# Patient Record
Sex: Male | Born: 1995 | Race: Black or African American | Hispanic: No | Marital: Single | Smoking: Never smoker
Health system: Southern US, Community
[De-identification: ages and names within clinical notes are randomized; demographics above are authoritative.]

## PROBLEM LIST (undated history)

## (undated) ENCOUNTER — Emergency Department (HOSPITAL_COMMUNITY): Admission: EM | Source: Home / Self Care

## (undated) DIAGNOSIS — D649 Anemia, unspecified: Secondary | ICD-10-CM

## (undated) DIAGNOSIS — D550 Anemia due to glucose-6-phosphate dehydrogenase [G6PD] deficiency: Secondary | ICD-10-CM

## (undated) DIAGNOSIS — D573 Sickle-cell trait: Secondary | ICD-10-CM

---
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
## 2013-11-28 ENCOUNTER — Encounter (HOSPITAL_COMMUNITY): Payer: Self-pay | Admitting: Emergency Medicine

## 2013-11-28 DIAGNOSIS — R1010 Upper abdominal pain, unspecified: Secondary | ICD-10-CM | POA: Diagnosis present

## 2013-11-28 DIAGNOSIS — R1013 Epigastric pain: Secondary | ICD-10-CM | POA: Diagnosis not present

## 2013-11-28 LAB — CBC WITH DIFFERENTIAL/PLATELET
BASOS ABS: 0 10*3/uL (ref 0.0–0.1)
BASOS PCT: 0 % (ref 0–1)
EOS ABS: 0.1 10*3/uL (ref 0.0–0.7)
Eosinophils Relative: 2 % (ref 0–5)
HCT: 41.8 % (ref 39.0–52.0)
Hemoglobin: 14.7 g/dL (ref 13.0–17.0)
Lymphocytes Relative: 48 % — ABNORMAL HIGH (ref 12–46)
Lymphs Abs: 2.2 10*3/uL (ref 0.7–4.0)
MCH: 30.6 pg (ref 26.0–34.0)
MCHC: 35.2 g/dL (ref 30.0–36.0)
MCV: 87.1 fL (ref 78.0–100.0)
MONO ABS: 0.4 10*3/uL (ref 0.1–1.0)
MONOS PCT: 9 % (ref 3–12)
NEUTROS ABS: 1.9 10*3/uL (ref 1.7–7.7)
Neutrophils Relative %: 41 % — ABNORMAL LOW (ref 43–77)
PLATELETS: 254 10*3/uL (ref 150–400)
RBC: 4.8 MIL/uL (ref 4.22–5.81)
RDW: 11.2 % — ABNORMAL LOW (ref 11.5–15.5)
WBC: 4.6 10*3/uL (ref 4.0–10.5)

## 2013-11-28 LAB — COMPREHENSIVE METABOLIC PANEL
ALBUMIN: 4.4 g/dL (ref 3.5–5.2)
ALT: 11 U/L (ref 0–53)
ANION GAP: 11 (ref 5–15)
AST: 21 U/L (ref 0–37)
Alkaline Phosphatase: 86 U/L (ref 39–117)
BILIRUBIN TOTAL: 0.7 mg/dL (ref 0.3–1.2)
BUN: 9 mg/dL (ref 6–23)
CALCIUM: 9.6 mg/dL (ref 8.4–10.5)
CHLORIDE: 100 meq/L (ref 96–112)
CO2: 27 meq/L (ref 19–32)
Creatinine, Ser: 0.98 mg/dL (ref 0.50–1.35)
GFR calc non Af Amer: >90 mL/min (ref >90)
GLUCOSE: 63 mg/dL — AB (ref 70–99)
Potassium: 3.8 mEq/L (ref 3.7–5.3)
SODIUM: 138 meq/L (ref 137–147)
TOTAL PROTEIN: 8.4 g/dL — AB (ref 6.0–8.3)

## 2013-11-28 LAB — LIPASE, BLOOD: Lipase: 28 U/L (ref 11–59)

## 2013-11-28 NOTE — ED Notes (Signed)
Pt. reports mid abdominal pain with emesis( x1) onset this evening , denies diarrhea/ no fever or chills.

## 2013-11-29 ENCOUNTER — Emergency Department (HOSPITAL_COMMUNITY)
Admission: EM | Admit: 2013-11-29 | Discharge: 2013-11-29 | Disposition: A | Discharge status: Home / Self Care | Payer: Medicaid Other | Attending: Emergency Medicine | Admitting: Emergency Medicine

## 2013-11-29 DIAGNOSIS — R1013 Epigastric pain: Secondary | ICD-10-CM

## 2013-11-29 LAB — URINALYSIS, ROUTINE W REFLEX MICROSCOPIC
BILIRUBIN URINE: NEGATIVE
GLUCOSE, UA: NEGATIVE mg/dL
Hgb urine dipstick: NEGATIVE
Ketones, ur: NEGATIVE mg/dL
LEUKOCYTES UA: NEGATIVE
Nitrite: NEGATIVE
Protein, ur: NEGATIVE mg/dL
SPECIFIC GRAVITY, URINE: 1.019 (ref 1.005–1.030)
UROBILINOGEN UA: 2 mg/dL — AB (ref 0.0–1.0)
pH: 6.5 (ref 5.0–8.0)

## 2013-11-29 MED ORDER — GI COCKTAIL ~~LOC~~
30.0000 mL | Freq: Once | ORAL | Status: AC
  Administered 2013-11-29: 30 mL via ORAL
  Filled 2013-11-29: qty 30

## 2013-11-29 MED ORDER — ONDANSETRON 8 MG PO TBDP: 8.0000 mg | ORAL_TABLET | Freq: Three times a day (TID) | ORAL | Status: DC | PRN

## 2013-11-29 MED ORDER — FAMOTIDINE 20 MG PO TABS: 20.0000 mg | ORAL_TABLET | Freq: Two times a day (BID) | ORAL | Status: DC

## 2013-11-29 MED ORDER — FAMOTIDINE 20 MG PO TABS
20.0000 mg | ORAL_TABLET | Freq: Once | ORAL | Status: AC
  Administered 2013-11-29: 20 mg via ORAL
  Filled 2013-11-29: qty 1

## 2013-11-29 NOTE — ED Provider Notes (Signed)
CSN: [id number]     Arrival date & time 11/28/13  1929 History   First MD Initiated Contact with Patient 11/29/13 0120     Chief Complaint  Patient presents with  . Abdominal Pain     (Consider location/radiation/quality/duration/timing/severity/associated sxs/prior Treatment) HPI 18 year old male presents to emergency room from home with complaint of upper abdominal pain and one episode of vomiting.  Patient reports eating a fried seafood restaurant Saturday.  He reports not being hungry on Sunday.  Today he had [employer] for dinner.  Soon after eating he had dull pain in his upper abdomen followed by one episode of vomiting of clear emesis.  He denies any further emesis no longer nauseated.  He reports a dull aching pain.  No fevers or chills no back pain no prior history of same.  No one else is sick. History reviewed. No pertinent past medical history. History reviewed. No pertinent past surgical history. No family history on file. History  Substance Use Topics  . Smoking status: Never Smoker   . Smokeless tobacco: Not on file  . Alcohol Use: No    Review of Systems   See History of Present Illness; otherwise all other systems are reviewed and negative  Allergies  Aspirin and Sulfa antibiotics  Home Medications   Prior to Admission medications   Not on File   BP 126/76  Pulse 50  Temp(Src) 98.2 F (36.8 C) (Oral)  Resp 20  Wt 187 lb 9 oz (85.078 kg)  SpO2 100% Physical Exam  Nursing note and vitals reviewed. Constitutional: He is oriented to person, place, and time. He appears well-developed and well-nourished.  HENT:  Head: Normocephalic and atraumatic.  Nose: Nose normal.  Mouth/Throat: Oropharynx is clear and moist.  Eyes: Conjunctivae and EOM are normal. Pupils are equal, round, and reactive to light.  Neck: Normal range of motion. Neck supple. No JVD present. No tracheal deviation present. No thyromegaly present.  Cardiovascular: Normal rate, regular  rhythm, normal heart sounds and intact distal pulses.  Exam reveals no gallop and no friction rub.   No murmur heard. Pulmonary/Chest: Effort normal and breath sounds normal. No stridor. No respiratory distress. He has no wheezes. He has no rales. He exhibits no tenderness.  Abdominal: Soft. Bowel sounds are normal. He exhibits no distension and no mass. There is no tenderness. There is no rebound and no guarding.  Musculoskeletal: Normal range of motion. He exhibits no edema and no tenderness.  Lymphadenopathy:    He has no cervical adenopathy.  Neurological: He is alert and oriented to person, place, and time. He displays normal reflexes. He exhibits normal muscle tone. Coordination normal.  Skin: Skin is warm and dry. No rash noted. No erythema. No pallor.  Psychiatric: He has a normal mood and affect. His behavior is normal. Judgment and thought content normal.    ED Course  Procedures (including critical care time) Labs Review Labs Reviewed  CBC WITH DIFFERENTIAL - Abnormal; Notable for the following:    RDW 11.2 (*)    Neutrophils Relative % 41 (*)    Lymphocytes Relative 48 (*)    All other components within normal limits  COMPREHENSIVE METABOLIC PANEL - Abnormal; Notable for the following:    Glucose, Bld 63 (*)    Total Protein 8.4 (*)    All other components within normal limits  URINALYSIS, ROUTINE W REFLEX MICROSCOPIC - Abnormal; Notable for the following:    Urobilinogen, UA 2.0 (*)    All other  components within normal limits  LIPASE, BLOOD    Imaging Review No results found.   EKG Interpretation None      MDM   Final diagnoses:  Epigastric pain    18 year old male with epigastric pain after eating [employer], after going a day and a half without food.  Patient is well appearing, labs are unremarkable.  He has no abdominal pain on exam.  Suspect gastritis.  We'll have him followup with his primary care doctor as needed.  Will treat with GI cocktail and Pepcid  here.  We'll have him take Pepcid for the next few days to help with any residual symptoms.    Olivia Mackielga M Lot Medford, MD 11/29/13 [phone]

## 2013-11-29 NOTE — Discharge Instructions (Signed)
Stick to a bland diet for the next few days.  Your workup today has not shown a specific cause for your symptoms.  It is thought that you have some stomach irritation.  Take medications as prescribed.  Return emergency room for worsening condition or new concerning symptoms.   Abdominal Pain Many things can cause belly (abdominal) pain. Most times, the belly pain is not dangerous. Many cases of belly pain can be watched and treated at home. HOME CARE   Do not take medicines that help you go poop (laxatives) unless told to by your doctor.  Only take medicine as told by your doctor.  Eat or drink as told by your doctor. Your doctor will tell you if you should be on a special diet. GET HELP IF:  You do not know what is causing your belly pain.  You have belly pain while you are sick to your stomach (nauseous) or have runny poop (diarrhea).  You have pain while you pee or poop.  Your belly pain wakes you up at night.  You have belly pain that gets worse or better when you eat.  You have belly pain that gets worse when you eat fatty foods.  You have a fever. GET HELP RIGHT AWAY IF:   The pain does not go away within 2 hours.  You keep throwing up (vomiting).  The pain changes and is only in the right or left part of the belly.  You have bloody or tarry looking poop. MAKE SURE YOU:   Understand these instructions.  Will watch your condition.  Will get help right away if you are not doing well or get worse. Document Released: 07/23/2007 Document Revised: 02/08/2013 Document Reviewed: 10/13/2012 [facility] Patient Information 2015 [employer], [place]. This information is not intended to replace advice given to you by your health care provider. Make sure you discuss any questions you have with your health care provider.

## 2014-05-17 ENCOUNTER — Encounter (HOSPITAL_COMMUNITY): Payer: Self-pay | Admitting: *Deleted

## 2014-05-17 ENCOUNTER — Emergency Department (INDEPENDENT_AMBULATORY_CARE_PROVIDER_SITE_OTHER)
Admission: EM | Admit: 2014-05-17 | Discharge: 2014-05-17 | Disposition: A | Discharge status: Home / Self Care | Payer: Medicaid Other | Source: Home / Self Care | Attending: Family Medicine | Admitting: Family Medicine

## 2014-05-17 DIAGNOSIS — J302 Other seasonal allergic rhinitis: Secondary | ICD-10-CM | POA: Diagnosis not present

## 2014-05-17 MED ORDER — IPRATROPIUM BROMIDE 0.06 % NA SOLN: 2.0000 | Freq: Four times a day (QID) | NASAL | Status: DC

## 2014-05-17 MED ORDER — METHYLPREDNISOLONE ACETATE 80 MG/ML IJ SUSP
80.0000 mg | Freq: Once | INTRAMUSCULAR | Status: AC
  Administered 2014-05-17: 80 mg via INTRAMUSCULAR

## 2014-05-17 MED ORDER — METHYLPREDNISOLONE ACETATE 80 MG/ML IJ SUSP
INTRAMUSCULAR | Status: AC
  Filled 2014-05-17: qty 1

## 2014-05-17 NOTE — ED Provider Notes (Signed)
CSN: [id number]     Arrival date & time 05/17/14  1922 History   First MD Initiated Contact with Patient 05/17/14 2017     Chief Complaint  Patient presents with  . Headache   (Consider location/radiation/quality/duration/timing/severity/associated sxs/prior Treatment) Patient is a 19 y.o. male presenting with headaches. The history is provided by the patient.  Headache Pain location:  L parietal Quality:  Dull Radiates to:  Does not radiate Progression:  Unchanged Chronicity:  New Similar to prior headaches: no   Context: not activity, not exposure to bright light, not caffeine, not defecating and not stress   Associated symptoms: congestion   Associated symptoms: no blurred vision, no dizziness, no fever, no loss of balance, no neck pain, no neck stiffness, no numbness and no photophobia     History reviewed. No pertinent past medical history. Past Surgical History  Procedure Laterality Date  . Wisdom tooth extraction  2012   History reviewed. No pertinent family history. History  Substance Use Topics  . Smoking status: Never Smoker   . Smokeless tobacco: Not on file  . Alcohol Use: Yes     Comment: occasional    Review of Systems  Constitutional: Negative.  Negative for fever.  HENT: Positive for congestion.   Eyes: Negative for blurred vision and photophobia.  Musculoskeletal: Negative for neck pain and neck stiffness.  Neurological: Positive for headaches. Negative for dizziness, numbness and loss of balance.    Allergies  Aspirin; Penicillins; and Sulfa antibiotics  Home Medications   Prior to Admission medications   Medication Sig Start Date End Date Taking? Authorizing Provider  ibuprofen (ADVIL,MOTRIN) 200 MG tablet Take 400 mg by mouth every 6 (six) hours as needed.   Yes Historical Provider, MD  famotidine (PEPCID) 20 MG tablet Take 1 tablet (20 mg total) by mouth 2 (two) times daily. 11/29/13 12/04/13  Marisa Severinlga Otter, MD  ipratropium (ATROVENT) 0.06 % nasal  spray Place 2 sprays into both nostrils 4 (four) times daily. 05/17/14   Linna HoffJames D Rei Contee, MD  ondansetron (ZOFRAN ODT) 8 MG disintegrating tablet Take 1 tablet (8 mg total) by mouth every 8 (eight) hours as needed for nausea or vomiting. 11/29/13   Marisa Severinlga Otter, MD   BP 137/74 mmHg  Pulse 53  Temp(Src) 98.5 F (36.9 C) (Oral)  Resp 16  SpO2 100% Physical Exam  Constitutional: He is oriented to person, place, and time. He appears well-developed and well-nourished.  Eyes: Conjunctivae and EOM are normal. Pupils are equal, round, and reactive to light.  Neck: Normal range of motion. Neck supple.  Musculoskeletal: Normal range of motion.  Lymphadenopathy:    He has no cervical adenopathy.  Neurological: He is alert and oriented to person, place, and time. He has normal reflexes. He displays normal reflexes. No cranial nerve deficit. Coordination normal.  Nursing note and vitals reviewed.   ED Course  Procedures (including critical care time) Labs Review Labs Reviewed - No data to display  Imaging Review No results found.   MDM   1. Seasonal allergic rhinitis        Linna HoffJames D Germany Chelf, MD 05/17/14 2056

## 2014-05-17 NOTE — ED Notes (Signed)
C/o headache onset yesterday @ 1130.  He tried Ibuprofen 400 mg. yesterday and 400 mg. @ 1330 today.  Pain on L parietal area.  No N or V.  No photophobia.  When he rubs his L eye it causes the pain in L parietal area.

## 2015-02-15 ENCOUNTER — Encounter (HOSPITAL_COMMUNITY): Payer: Self-pay | Admitting: Cardiology

## 2015-02-15 ENCOUNTER — Emergency Department (HOSPITAL_COMMUNITY)
Admission: EM | Admit: 2015-02-15 | Discharge: 2015-02-15 | Disposition: A | Discharge status: Home / Self Care | Payer: Medicaid Other | Attending: Emergency Medicine | Admitting: Emergency Medicine

## 2015-02-15 DIAGNOSIS — Z880 Allergy status to penicillin: Secondary | ICD-10-CM | POA: Insufficient documentation

## 2015-02-15 DIAGNOSIS — M25552 Pain in left hip: Secondary | ICD-10-CM | POA: Insufficient documentation

## 2015-02-15 DIAGNOSIS — M25551 Pain in right hip: Secondary | ICD-10-CM | POA: Insufficient documentation

## 2015-02-15 DIAGNOSIS — Z79899 Other long term (current) drug therapy: Secondary | ICD-10-CM | POA: Insufficient documentation

## 2015-02-15 DIAGNOSIS — M7918 Myalgia, other site: Secondary | ICD-10-CM

## 2015-02-15 MED ORDER — NAPROXEN 500 MG PO TABS: 500.0000 mg | ORAL_TABLET | Freq: Two times a day (BID) | ORAL | Status: DC

## 2015-02-15 NOTE — ED Notes (Signed)
Pt reports bilateral hip pain that started about a week ago. Denies any injury or other symptoms.

## 2015-02-15 NOTE — ED Provider Notes (Signed)
CSN: [id number]     Arrival date & time 02/15/15  1657 History  By signing my name below, I, Justin Sheppard, attest that this documentation has been prepared under the direction and in the presence of General MillsBenjamin Keante Urizar, PA-C. Electronically Signed: Angelene GiovanniEmmanuella Sheppard, ED Scribe. 02/15/2015. 7:08 PM.     Chief Complaint  Patient presents with  . Hip Pain   The history is provided by the patient. No language interpreter was used.   HPI Comments: Justin Sheppard is a 19 y.o. male who presents to the Emergency Department complaining of bilateral side hip pain onset one week ago. Unable to characterize pain. He states that the pain is worse when he initially tries to move but once he gets going, he feels much better. He explains that the pain is better with warmth and worse with the cold. He reports that he sits a lot in the office at work. He states that he is here because he has a family hx of gout and arthritis. No alleviating factors noted. He denies any fever, SOB, rash, n/v, urinary symptoms, back pain, numbness or weakness, or bowel/bladder incontinence.     History reviewed. No pertinent past medical history. Past Surgical History  Procedure Laterality Date  . Wisdom tooth extraction  2012   History reviewed. No pertinent family history. Social History  Substance Use Topics  . Smoking status: Never Smoker   . Smokeless tobacco: None  . Alcohol Use: Yes     Comment: occasional    Review of Systems  Constitutional: Negative for fever.  Respiratory: Negative for shortness of breath.   Gastrointestinal: Negative for nausea and vomiting.  Genitourinary: Negative for dysuria, frequency and hematuria.  Musculoskeletal: Positive for arthralgias (bilateral hip pain). Negative for back pain.  Skin: Negative for rash.  All other systems reviewed and are negative.     Allergies  Aspirin; Penicillins; and Sulfa antibiotics  Home Medications   Prior to Admission medications    Medication Sig Start Date End Date Taking? Authorizing Provider  famotidine (PEPCID) 20 MG tablet Take 1 tablet (20 mg total) by mouth 2 (two) times daily. 11/29/13 12/04/13  Marisa Severinlga Otter, MD  ibuprofen (ADVIL,MOTRIN) 200 MG tablet Take 400 mg by mouth every 6 (six) hours as needed.    Historical Provider, MD  ipratropium (ATROVENT) 0.06 % nasal spray Place 2 sprays into both nostrils 4 (four) times daily. 05/17/14   Linna HoffJames D Kindl, MD  naproxen (NAPROSYN) 500 MG tablet Take 1 tablet (500 mg total) by mouth 2 (two) times daily. 02/15/15   Joycie PeekBenjamin Gifford Ballon, PA-C  ondansetron (ZOFRAN ODT) 8 MG disintegrating tablet Take 1 tablet (8 mg total) by mouth every 8 (eight) hours as needed for nausea or vomiting. 11/29/13   Marisa Severinlga Otter, MD   BP 134/59 mmHg  Pulse 56  Temp(Src) 98 F (36.7 C) (Oral)  Resp 16  SpO2 100% Physical Exam  Constitutional: He is oriented to person, place, and time. He appears well-developed and well-nourished. No distress.  HENT:  Head: Normocephalic and atraumatic.  Eyes: Conjunctivae and EOM are normal.  Neck: Neck supple. No tracheal deviation present.  Cardiovascular: Normal rate, regular rhythm and normal heart sounds.   Pulmonary/Chest: Effort normal. No respiratory distress.  Abdominal: Soft. There is no tenderness.  Musculoskeletal: Normal range of motion.  Discomfort exacerbated with standing up, adduction of bilateral hips. No redness, swelling or erythema Full ROM of BLE as well as lumbar spine   Neurological: He is alert and oriented to  person, place, and time.  Gait is baseline without ataxia. Motor strength and sensation are baseline.  Skin: Skin is warm and dry.  Psychiatric: He has a normal mood and affect. His behavior is normal.  Nursing note and vitals reviewed.   ED Course  Procedures (including critical care time) DIAGNOSTIC STUDIES: Oxygen Saturation is 100% on RA, normal by my interpretation.    COORDINATION OF CARE: 6:40 PM- Pt advised of  plan for treatment and pt agrees. Recommended to take hot showers and use heating pads. Explained that symptoms are not consistent with gout or arthritis.   Meds given in ED:  Medications - No data to display  New Prescriptions   NAPROXEN (NAPROSYN) 500 MG TABLET    Take 1 tablet (500 mg total) by mouth 2 (two) times daily.   Filed Vitals:   02/15/15 1811 02/15/15 1913  BP: 129/80 134/59  Pulse: 57 56  Temp: 98 F (36.7 C)   TempSrc: Oral   Resp: 18 16  SpO2: 100% 100%    MDM  Justin Sheppard is a 19 y.o. male who presents for evaluation of bilateral hip pain. Patient has nonfocal neuro exam. Discomfort most consistent with musculoskeletal pain. We'll try trial of NSAIDs and stretching exercises. Discussed follow-up with PCP for further evaluation and management of symptoms. Administration agrees with this plan and is amenable to discharge. No evidence of other acute or emergent pathology. The patient appears reasonably screened and/or stabilized for discharge and I doubt any other medical condition or other [facility] requiring further screening, evaluation, or treatment in the ED at this time prior to discharge.   Final diagnoses:  Musculoskeletal pain    I personally performed the services described in this documentation, which was scribed in my presence. The recorded information has been reviewed and is accurate.   Joycie Peek, PA-C 02/15/15 1919  Margarita Grizzle, MD 02/17/15 [phone]

## 2015-02-15 NOTE — Discharge Instructions (Signed)
There is not appear to be an emergent cause for your symptoms at this time. Your discomfort is likely due to musculoskeletal pain. Take your medications as prescribed. He may try stretching and other symptoms management as we discussed. Return to ED for any new or worsening symptoms.  Musculoskeletal Pain Musculoskeletal pain is muscle and boney aches and pains. These pains can occur in any part of the body. Your caregiver may treat you without knowing the cause of the pain. They may treat you if blood or urine tests, X-rays, and other tests were normal.  CAUSES There is often not a definite cause or reason for these pains. These pains may be caused by a type of germ (virus). The discomfort may also come from overuse. Overuse includes working out too hard when your body is not fit. Boney aches also come from weather changes. Bone is sensitive to atmospheric pressure changes. HOME CARE INSTRUCTIONS   Ask when your test results will be ready. Make sure you get your test results.  Only take over-the-counter or prescription medicines for pain, discomfort, or fever as directed by your caregiver. If you were given medications for your condition, do not drive, operate machinery or power tools, or sign legal documents for 24 hours. Do not drink alcohol. Do not take sleeping pills or other medications that may interfere with treatment.  Continue all activities unless the activities cause more pain. When the pain lessens, slowly resume normal activities. Gradually increase the intensity and duration of the activities or exercise.  During periods of severe pain, bed rest may be helpful. Lay or sit in any position that is comfortable.  Putting ice on the injured area.  Put ice in a bag.  Place a towel between your skin and the bag.  Leave the ice on for 15 to 20 minutes, 3 to 4 times a day.  Follow up with your caregiver for continued problems and no reason can be found for the pain. If the pain becomes  worse or does not go away, it may be necessary to repeat tests or do additional testing. Your caregiver may need to look further for a possible cause. SEEK IMMEDIATE MEDICAL CARE IF:  You have pain that is getting worse and is not relieved by medications.  You develop chest pain that is associated with shortness or breath, sweating, feeling sick to your stomach (nauseous), or throw up (vomit).  Your pain becomes localized to the abdomen.  You develop any new symptoms that seem different or that concern you. MAKE SURE YOU:   Understand these instructions.  Will watch your condition.  Will get help right away if you are not doing well or get worse.   This information is not intended to replace advice given to you by your health care provider. Make sure you discuss any questions you have with your health care provider.   Document Released: 02/03/2005 Document Revised: 04/28/2011 Document Reviewed: 10/08/2012 Elsevier Interactive Patient Education [employer]2016 [employer]

## 2015-02-15 NOTE — ED Notes (Signed)
Pt ambulatory to room with steady gait, NAD 

## 2015-04-16 ENCOUNTER — Encounter (HOSPITAL_COMMUNITY): Payer: Self-pay | Admitting: *Deleted

## 2015-04-16 DIAGNOSIS — Z791 Long term (current) use of non-steroidal anti-inflammatories (NSAID): Secondary | ICD-10-CM | POA: Insufficient documentation

## 2015-04-16 DIAGNOSIS — Z79899 Other long term (current) drug therapy: Secondary | ICD-10-CM | POA: Insufficient documentation

## 2015-04-16 DIAGNOSIS — R112 Nausea with vomiting, unspecified: Secondary | ICD-10-CM | POA: Insufficient documentation

## 2015-04-16 DIAGNOSIS — Z880 Allergy status to penicillin: Secondary | ICD-10-CM | POA: Insufficient documentation

## 2015-04-16 NOTE — ED Notes (Signed)
Pt in c/o vomiting x1 and headache that started this evening, no distress noted

## 2015-04-17 ENCOUNTER — Emergency Department (HOSPITAL_COMMUNITY)
Admission: EM | Admit: 2015-04-17 | Discharge: 2015-04-17 | Disposition: A | Discharge status: Home / Self Care | Payer: Medicaid Other | Attending: Emergency Medicine | Admitting: Emergency Medicine

## 2015-04-17 DIAGNOSIS — R112 Nausea with vomiting, unspecified: Secondary | ICD-10-CM

## 2015-04-17 MED ORDER — ONDANSETRON 4 MG PO TBDP
4.0000 mg | ORAL_TABLET | Freq: Once | ORAL | Status: AC
  Administered 2015-04-17: 4 mg via ORAL

## 2015-04-17 MED ORDER — ONDANSETRON 4 MG PO TBDP: 4.0000 mg | ORAL_TABLET | Freq: Three times a day (TID) | ORAL | Status: DC | PRN

## 2015-04-17 NOTE — ED Notes (Signed)
Pt provided PO challenge

## 2015-04-17 NOTE — Discharge Instructions (Signed)

## 2015-04-17 NOTE — ED Provider Notes (Signed)
CSN: [id number]     Arrival date & time 04/16/15  2240 History   By signing my name below, I, Justin Sheppard, attest that this documentation has been prepared under the direction and in the presence of Shon Baton, MD.  Electronically Signed: Arlan Sheppard, ED Scribe. 04/17/2015. 2:49 AM.   Chief Complaint  Patient presents with  . Emesis   The history is provided by the patient. No language interpreter was used.    HPI Comments: Justin Sheppard is a 20 y.o. male without any pertinent past medical history who presents to the Emergency Department complaining of sudden onset, resolving vomiting with associated nausea onset 2-3 hours prior to arrival. Pt reports 2 episodes of vomiting while at a game earlier this evening and after eating some pizza. No OTC medications or home remedies attempted prior to arrival. No recent fever, chills, diarrhea, chest pain, shortness of breath, or abdominal pain. No alcohol consumption or illicit drug use.  PCP: No PCP Per Patient    History reviewed. No pertinent past medical history. Past Surgical History  Procedure Laterality Date  . Wisdom tooth extraction  2012   History reviewed. No pertinent family history. Social History  Substance Use Topics  . Smoking status: Never Smoker   . Smokeless tobacco: None  . Alcohol Use: Yes     Comment: occasional    Review of Systems  Constitutional: Negative for fever and chills.  Respiratory: Negative for cough and shortness of breath.   Cardiovascular: Negative for chest pain.  Gastrointestinal: Positive for nausea and vomiting. Negative for abdominal pain and diarrhea.  Genitourinary: Negative for dysuria.  Musculoskeletal: Negative for back pain.  Neurological: Negative for headaches.  Psychiatric/Behavioral: Negative for confusion.  All other systems reviewed and are negative.     Allergies  Aspirin; Penicillins; and Sulfa antibiotics  Home Medications   Prior to Admission medications    Medication Sig Start Date End Date Taking? Authorizing Provider  famotidine (PEPCID) 20 MG tablet Take 1 tablet (20 mg total) by mouth 2 (two) times daily. 11/29/13 12/04/13  Marisa Severin, MD  ibuprofen (ADVIL,MOTRIN) 200 MG tablet Take 400 mg by mouth every 6 (six) hours as needed.    Historical Provider, MD  ipratropium (ATROVENT) 0.06 % nasal spray Place 2 sprays into both nostrils 4 (four) times daily. 05/17/14   Linna Hoff, MD  naproxen (NAPROSYN) 500 MG tablet Take 1 tablet (500 mg total) by mouth 2 (two) times daily. 02/15/15   Joycie Peek, PA-C  ondansetron (ZOFRAN-ODT) 4 MG disintegrating tablet Take 1 tablet (4 mg total) by mouth every 8 (eight) hours as needed for nausea or vomiting. 04/17/15   Shon Baton, MD   Triage Vitals: BP 131/67 mmHg  Pulse 40  Temp(Src) 97.7 F (36.5 C) (Oral)  Resp 16  Ht  (1.854 m)  Wt 185 lb (83.915 kg)  BMI 24.41 kg/m2  SpO2 100%   Physical Exam  Constitutional: He is oriented to person, place, and time. He appears well-developed and well-nourished. No distress.  HENT:  Head: Normocephalic and atraumatic.  Cardiovascular: Normal rate, regular rhythm and normal heart sounds.   No murmur heard. Pulmonary/Chest: Effort normal and breath sounds normal. No respiratory distress. He has no wheezes.  Abdominal: Soft. Bowel sounds are normal. There is no tenderness. There is no rebound and no guarding.  Neurological: He is alert and oriented to person, place, and time.  Skin: Skin is warm and dry.  Psychiatric: He has a  normal mood and affect.  Nursing note and vitals reviewed.   ED Course  Procedures (including critical care time)  DIAGNOSTIC STUDIES: Oxygen Saturation is 100% on RA, Normal by my interpretation.    COORDINATION OF CARE: 2:40 AM-Discussed treatment plan with pt at bedside and pt agreed to plan.     Labs Review Labs Reviewed - No data to display  Imaging Review No results found. I have personally reviewed  and evaluated these images and lab results as part of my medical decision-making.   EKG Interpretation None      MDM   Final diagnoses:  Non-intractable vomiting with nausea, vomiting of unspecified type    Patient presents with isolated vomiting after eating pizza prior to arrival. He states that he feels much better. No associated abdominal pain. Patient may have experienced gastritis versus mild food poisoning. He was given Zofran and able to tolerate fluids. Physical exam is benign. No lab work indicated at this time.   After history, exam, and medical workup I feel the patient has been appropriately medically screened and is safe for discharge home. Pertinent diagnoses were discussed with the patient. Patient was given return precautions.  I personally performed the services described in this documentation, which was scribed in my presence. The recorded information has been reviewed and is accurate.   Shon Baton, MD 04/17/15 [phone]

## 2015-04-17 NOTE — ED Notes (Signed)
Pt passed PO challenge.

## 2015-10-18 ENCOUNTER — Emergency Department (HOSPITAL_COMMUNITY)
Admission: EM | Admit: 2015-10-18 | Discharge: 2015-10-19 | Disposition: A | Discharge status: Home / Self Care | Payer: Medicaid Other | Attending: Emergency Medicine | Admitting: Emergency Medicine

## 2015-10-18 ENCOUNTER — Encounter (HOSPITAL_COMMUNITY): Payer: Self-pay | Admitting: Emergency Medicine

## 2015-10-18 DIAGNOSIS — Z79899 Other long term (current) drug therapy: Secondary | ICD-10-CM | POA: Insufficient documentation

## 2015-10-18 DIAGNOSIS — J029 Acute pharyngitis, unspecified: Secondary | ICD-10-CM | POA: Insufficient documentation

## 2015-10-18 DIAGNOSIS — R6889 Other general symptoms and signs: Secondary | ICD-10-CM

## 2015-10-18 DIAGNOSIS — J069 Acute upper respiratory infection, unspecified: Secondary | ICD-10-CM

## 2015-10-18 LAB — COMPREHENSIVE METABOLIC PANEL
ALBUMIN: 3.9 g/dL (ref 3.5–5.0)
ALT: 19 U/L (ref 17–63)
AST: 28 U/L (ref 15–41)
Alkaline Phosphatase: 71 U/L (ref 38–126)
Anion gap: 6 (ref 5–15)
BUN: 10 mg/dL (ref 6–20)
CHLORIDE: 105 mmol/L (ref 101–111)
CO2: 26 mmol/L (ref 22–32)
Calcium: 8.9 mg/dL (ref 8.9–10.3)
Creatinine, Ser: 1.05 mg/dL (ref 0.61–1.24)
GFR calc Af Amer: >60 mL/min (ref >60)
GFR calc non Af Amer: >60 mL/min (ref >60)
GLUCOSE: 100 mg/dL — AB (ref 65–99)
POTASSIUM: 3.7 mmol/L (ref 3.5–5.1)
Sodium: 137 mmol/L (ref 135–145)
Total Bilirubin: 0.7 mg/dL (ref 0.3–1.2)
Total Protein: 7.1 g/dL (ref 6.5–8.1)

## 2015-10-18 LAB — CBC WITH DIFFERENTIAL/PLATELET
BASOS ABS: 0 10*3/uL (ref 0.0–0.1)
BASOS PCT: 0 %
EOS ABS: 0.1 10*3/uL (ref 0.0–0.7)
EOS PCT: 1 %
HCT: 40.3 % (ref 39.0–52.0)
Hemoglobin: 13.7 g/dL (ref 13.0–17.0)
Lymphocytes Relative: 17 %
Lymphs Abs: 1.3 10*3/uL (ref 0.7–4.0)
MCH: 30.6 pg (ref 26.0–34.0)
MCHC: 34 g/dL (ref 30.0–36.0)
MCV: 90 fL (ref 78.0–100.0)
MONO ABS: 0.8 10*3/uL (ref 0.1–1.0)
Monocytes Relative: 10 %
Neutro Abs: 5.6 10*3/uL (ref 1.7–7.7)
Neutrophils Relative %: 72 %
PLATELETS: 263 10*3/uL (ref 150–400)
RBC: 4.48 MIL/uL (ref 4.22–5.81)
RDW: 11.9 % (ref 11.5–15.5)
WBC: 7.8 10*3/uL (ref 4.0–10.5)

## 2015-10-18 MED ORDER — DEXAMETHASONE SODIUM PHOSPHATE 10 MG/ML IJ SOLN
10.0000 mg | Freq: Once | INTRAMUSCULAR | Status: AC
  Administered 2015-10-19: 10 mg via INTRAVENOUS
  Filled 2015-10-18: qty 1

## 2015-10-18 MED ORDER — SODIUM CHLORIDE 0.9 % IV BOLUS (SEPSIS)
1000.0000 mL | Freq: Once | INTRAVENOUS | Status: AC
  Administered 2015-10-19: 1000 mL via INTRAVENOUS

## 2015-10-18 NOTE — ED Triage Notes (Signed)
Pt c/o generalized body aches with chills and sore throat x's 4 days.

## 2015-10-18 NOTE — ED Provider Notes (Signed)
[facility]-EMERGENCY DEPT Provider Note   CSN: [id number] Arrival date & time: 10/18/15  2223  By signing my name below, I, Linna DarnerRussell Turner, attest that this documentation has been prepared under the direction and in the presence of physician practitioner, Gilda Creasehristopher J Rayn Shorb, MD. Electronically Signed: Linna Darnerussell Turner, Scribe. 10/18/2015. 11:42 PM.  History   Chief Complaint Chief Complaint  Patient presents with  . Generalized Body Aches    The history is provided by the patient. No language interpreter was used.     HPI Comments: Justin Sheppard is a 20 y.o. male who presents to the Emergency Department complaining of sudden onset, constant, sore throat for the last 4 days. Pt endorses associated generalized myalgias, cough, congestion, fever, and chills as well. He reports he has not been hydrating as much since onset due to sore throat exacerbation with swallowing. He denies sick contacts with similar symptoms. Pt further denies trouble swallowing, nausea, vomiting, diarrhea, rash, tick bites, or any other associated symptoms.  History reviewed. No pertinent past medical history.  There are no active problems to display for this patient.   Past Surgical History:  Procedure Laterality Date  . WISDOM TOOTH EXTRACTION  2012       Home Medications    Prior to Admission medications   Medication Sig Start Date End Date Taking? Authorizing Provider  famotidine (PEPCID) 20 MG tablet Take 1 tablet (20 mg total) by mouth 2 (two) times daily. 11/29/13 12/04/13  Marisa Severinlga Otter, MD  ibuprofen (ADVIL,MOTRIN) 200 MG tablet Take 400 mg by mouth every 6 (six) hours as needed.    Historical Provider, MD  ipratropium (ATROVENT) 0.06 % nasal spray Place 2 sprays into both nostrils 4 (four) times daily. 05/17/14   Linna HoffJames D Kindl, MD  naproxen (NAPROSYN) 500 MG tablet Take 1 tablet (500 mg total) by mouth 2 (two) times daily. 02/15/15   Joycie PeekBenjamin Cartner, PA-C  ondansetron (ZOFRAN-ODT) 4 MG  disintegrating tablet Take 1 tablet (4 mg total) by mouth every 8 (eight) hours as needed for nausea or vomiting. 04/17/15   Shon Batonourtney F Horton, MD    Family History No family history on file.  Social History Social History  Substance Use Topics  . Smoking status: Never Smoker  . Smokeless tobacco: Never Used  . Alcohol use Yes     Comment: occasional     Allergies   Aspirin; Penicillins; and Sulfa antibiotics   Review of Systems Review of Systems  Constitutional: Positive for chills and fever.  HENT: Positive for congestion and sore throat. Negative for trouble swallowing.   Respiratory: Positive for cough.   Gastrointestinal: Negative for diarrhea, nausea and vomiting.  Musculoskeletal: Positive for myalgias (generalized).  Skin: Negative for rash.  All other systems reviewed and are negative.   Physical Exam Updated Vital Signs BP 162/89 (BP Location: Right Arm)   Pulse 82   Temp 98.9 F (37.2 C) (Oral)   Resp 24   Ht 6\' 1"  (1.854 m)   Wt 207 lb (93.9 kg)   SpO2 99%   BMI 27.31 kg/m   Physical Exam  Constitutional: He is oriented to person, place, and time. He appears well-developed and well-nourished. No distress.  HENT:  Head: Normocephalic and atraumatic.  Right Ear: Hearing normal.  Left Ear: Hearing normal.  Nose: Nose normal.  Mouth/Throat: Mucous membranes are normal. Posterior oropharyngeal erythema present.  Eyes: Conjunctivae and EOM are normal. Pupils are equal, round, and reactive to light.  Neck: Normal range of motion. Neck supple.  Cardiovascular: Regular rhythm, S1 normal and S2 normal.  Exam reveals no gallop and no friction rub.   No murmur heard. Pulmonary/Chest: Effort normal and breath sounds normal. No respiratory distress. He exhibits no tenderness.  Abdominal: Soft. Normal appearance and bowel sounds are normal. There is no hepatosplenomegaly. There is no tenderness. There is no rebound, no guarding, no tenderness at McBurney's point  and negative Murphy's sign. No hernia.  Musculoskeletal: Normal range of motion.  Neurological: He is alert and oriented to person, place, and time. He has normal strength. No cranial nerve deficit or sensory deficit. Coordination normal. GCS eye subscore is 4. GCS verbal subscore is 5. GCS motor subscore is 6.  Skin: Skin is warm, dry and intact. No rash noted. No cyanosis.  Psychiatric: He has a normal mood and affect. His speech is normal and behavior is normal. Thought content normal.  Nursing note and vitals reviewed.   ED Treatments / Results  Labs (all labs ordered are listed, but only abnormal results are displayed) Labs Reviewed  COMPREHENSIVE METABOLIC PANEL - Abnormal; Notable for the following:       Result Value   Glucose, Bld 100 (*)    All other components within normal limits  CBC WITH DIFFERENTIAL/PLATELET    EKG  EKG Interpretation None       Radiology No results found.  Procedures Procedures (including critical care time)  DIAGNOSTIC STUDIES: Oxygen Saturation is 99% on RA, normal by my interpretation.    COORDINATION OF CARE: 11:49 PM Discussed treatment plan with pt at bedside and pt agreed to plan.  Medications Ordered in ED Medications - No data to display   Initial Impression / Assessment and Plan / ED Course  I have reviewed the triage vital signs and the nursing notes.  Pertinent labs & imaging results that were available during my care of the patient were reviewed by me and considered in my medical decision making (see chart for details).  Clinical Course    Patient presents to the ER for evaluation of fever, chills, malaise, sore throat and nasal congestion for 4 days. Patient has not had any GI symptoms. He denies cough. Lungs are clear, no clinical concern for pneumonia. Abdominal exam benign. Rapid strep negative. Mono negative. Labs normal. Patient hydrated, administer Decadron for pharyngitis. Will treat symptomatically.  I  personally performed the services described in this documentation, which was scribed in my presence. The recorded information has been reviewed and is accurate.  Final Clinical Impressions(s) / ED Diagnoses   Final diagnoses:  None  URI Pharyngitis  New Prescriptions New Prescriptions   No medications on file     Gilda Crease, MD 10/19/15 [phone]

## 2015-10-19 LAB — RAPID STREP SCREEN (MED CTR MEBANE ONLY): Streptococcus, Group A Screen (Direct): NEGATIVE

## 2015-10-19 LAB — MONONUCLEOSIS SCREEN: MONO SCREEN: NEGATIVE

## 2015-10-19 MED ORDER — FLUTICASONE PROPIONATE 50 MCG/ACT NA SUSP: 1.0000 | Freq: Every day | NASAL | 0 refills | Status: DC

## 2015-10-21 LAB — CULTURE, GROUP A STREP (THRC)

## 2016-04-27 ENCOUNTER — Emergency Department (HOSPITAL_COMMUNITY): Payer: Self-pay

## 2016-04-27 ENCOUNTER — Emergency Department (HOSPITAL_COMMUNITY)
Admission: EM | Admit: 2016-04-27 | Discharge: 2016-04-27 | Disposition: A | Discharge status: Home / Self Care | Payer: Self-pay | Attending: Emergency Medicine | Admitting: Emergency Medicine

## 2016-04-27 ENCOUNTER — Encounter (HOSPITAL_COMMUNITY): Payer: Self-pay

## 2016-04-27 DIAGNOSIS — R1084 Generalized abdominal pain: Secondary | ICD-10-CM | POA: Insufficient documentation

## 2016-04-27 LAB — URINALYSIS, ROUTINE W REFLEX MICROSCOPIC
Bilirubin Urine: NEGATIVE
GLUCOSE, UA: NEGATIVE mg/dL
HGB URINE DIPSTICK: NEGATIVE
Ketones, ur: NEGATIVE mg/dL
LEUKOCYTES UA: NEGATIVE
Nitrite: NEGATIVE
PROTEIN: NEGATIVE mg/dL
SPECIFIC GRAVITY, URINE: 1.006 (ref 1.005–1.030)
pH: 8 (ref 5.0–8.0)

## 2016-04-27 LAB — CBC
HEMATOCRIT: 41.6 % (ref 39.0–52.0)
HEMOGLOBIN: 14.3 g/dL (ref 13.0–17.0)
MCH: 30.2 pg (ref 26.0–34.0)
MCHC: 34.4 g/dL (ref 30.0–36.0)
MCV: 87.8 fL (ref 78.0–100.0)
PLATELETS: 271 10*3/uL (ref 150–400)
RBC: 4.74 MIL/uL (ref 4.22–5.81)
RDW: 11.3 % — ABNORMAL LOW (ref 11.5–15.5)
WBC: 3.1 10*3/uL — AB (ref 4.0–10.5)

## 2016-04-27 LAB — COMPREHENSIVE METABOLIC PANEL
ALT: 14 U/L — AB (ref 17–63)
ANION GAP: 8 (ref 5–15)
AST: 18 U/L (ref 15–41)
Albumin: 4.1 g/dL (ref 3.5–5.0)
Alkaline Phosphatase: 74 U/L (ref 38–126)
BUN: 10 mg/dL (ref 6–20)
CHLORIDE: 106 mmol/L (ref 101–111)
CO2: 26 mmol/L (ref 22–32)
Calcium: 9.3 mg/dL (ref 8.9–10.3)
Creatinine, Ser: 0.95 mg/dL (ref 0.61–1.24)
Glucose, Bld: 88 mg/dL (ref 65–99)
POTASSIUM: 3.6 mmol/L (ref 3.5–5.1)
Sodium: 140 mmol/L (ref 135–145)
Total Bilirubin: 1.2 mg/dL (ref 0.3–1.2)
Total Protein: 7.8 g/dL (ref 6.5–8.1)

## 2016-04-27 LAB — LIPASE, BLOOD: LIPASE: 15 U/L (ref 11–51)

## 2016-04-27 MED ORDER — IOPAMIDOL (ISOVUE-300) INJECTION 61%
INTRAVENOUS | Status: AC
  Administered 2016-04-27: 100 mL
  Filled 2016-04-27: qty 100

## 2016-04-27 MED ORDER — DICYCLOMINE HCL 20 MG PO TABS: 20.0000 mg | ORAL_TABLET | Freq: Two times a day (BID) | ORAL | 0 refills | Status: DC

## 2016-04-27 MED ORDER — SODIUM CHLORIDE 0.9 % IV BOLUS (SEPSIS)
1000.0000 mL | Freq: Once | INTRAVENOUS | Status: AC
  Administered 2016-04-27: 1000 mL via INTRAVENOUS

## 2016-04-27 MED ORDER — ONDANSETRON HCL 4 MG/2ML IJ SOLN
4.0000 mg | Freq: Once | INTRAMUSCULAR | Status: AC
  Administered 2016-04-27: 4 mg via INTRAVENOUS
  Filled 2016-04-27: qty 2

## 2016-04-27 MED ORDER — SODIUM CHLORIDE 0.9 % IV SOLN
INTRAVENOUS | Status: DC
  Administered 2016-04-27: 14:00:00 via INTRAVENOUS

## 2016-04-27 NOTE — ED Provider Notes (Signed)
[facility]PT Provider Note   CSN: [id number] Arrival date & time: 04/27/16  1150     History   Chief Complaint Chief Complaint  Patient presents with  . Abdominal Pain  . Emesis    HPI Delano Ishman is a 21 y.o. male.  21 year old male presents with one-day history of bilateral lower abdominal pain with associated nonbilious emesis without fever, chills, diarrhea. Denies any urinary symptoms. Symptoms have been progressively worse over the past several hours. No prior history of same. Nothing makes them better. No treatment use prior to arrival      History reviewed. No pertinent past medical history.  There are no active problems to display for this patient.   Past Surgical History:  Procedure Laterality Date  . WISDOM TOOTH EXTRACTION  2012       Home Medications    Prior to Admission medications   Medication Sig Start Date End Date Taking? Authorizing Provider  fluticasone (FLONASE) 50 MCG/ACT nasal spray Place 1 spray into both nostrils daily. 10/19/15   Gilda Crease, MD  ibuprofen (ADVIL,MOTRIN) 200 MG tablet Take 400 mg by mouth every 6 (six) hours as needed for moderate pain.     Historical Provider, MD    Family History No family history on file.  Social History Social History  Substance Use Topics  . Smoking status: Never Smoker  . Smokeless tobacco: Never Used  . Alcohol use Yes     Comment: occasional     Allergies   Aspirin; Penicillins; and Sulfa antibiotics   Review of Systems Review of Systems  All other systems reviewed and are negative.    Physical Exam Updated Vital Signs BP 133/84   Pulse (!) 50   Temp 99.1 F (37.3 C) (Oral)   Resp 18   SpO2 100%   Physical Exam  Constitutional: He is oriented to person, place, and time. He appears well-developed and well-nourished.  Non-toxic appearance. No distress.  HENT:  Head: Normocephalic and atraumatic.  Eyes: Conjunctivae, EOM and lids are normal. Pupils  are equal, round, and reactive to light.  Neck: Normal range of motion. Neck supple. No tracheal deviation present. No thyroid mass present.  Cardiovascular: Normal rate, regular rhythm and normal heart sounds.  Exam reveals no gallop.   No murmur heard. Pulmonary/Chest: Effort normal and breath sounds normal. No stridor. No respiratory distress. He has no decreased breath sounds. He has no wheezes. He has no rhonchi. He has no rales.  Abdominal: Soft. Normal appearance and bowel sounds are normal. He exhibits no distension. There is tenderness in the right lower quadrant and suprapubic area. There is no rebound and no CVA tenderness.    Musculoskeletal: Normal range of motion. He exhibits no edema or tenderness.  Neurological: He is alert and oriented to person, place, and time. He has normal strength. No cranial nerve deficit or sensory deficit. GCS eye subscore is 4. GCS verbal subscore is 5. GCS motor subscore is 6.  Skin: Skin is warm and dry. No abrasion and no rash noted.  Psychiatric: He has a normal mood and affect. His speech is normal and behavior is normal.  Nursing note and vitals reviewed.    ED Treatments / Results  Labs (all labs ordered are listed, but only abnormal results are displayed) Labs Reviewed  LIPASE, BLOOD  COMPREHENSIVE METABOLIC PANEL  CBC  URINALYSIS, ROUTINE W REFLEX MICROSCOPIC    EKG  EKG Interpretation None       Radiology No results  found.  Procedures Procedures (including critical care time)  Medications Ordered in ED Medications  sodium chloride 0.9 % bolus 1,000 mL (not administered)  0.9 %  sodium chloride infusion (not administered)  ondansetron (ZOFRAN) injection 4 mg (not administered)     Initial Impression / Assessment and Plan / ED Course  I have reviewed the triage vital signs and the nursing notes.  Pertinent labs & imaging results that were available during my care of the patient were reviewed by me and considered in  my medical decision making (see chart for details).     Patient treated with IV fluids and Zofran and feels better. Abdominal CT without acute findings. Abdominal exam repeated and is benign. Patient stable for discharge  Final Clinical Impressions(s) / ED Diagnoses   Final diagnoses:  None    New Prescriptions New Prescriptions   No medications on file     Lorre NickAnthony Erique Kaser, MD 04/27/16 1616

## 2016-04-27 NOTE — ED Notes (Signed)
Patient transported to CT 

## 2016-04-27 NOTE — ED Notes (Signed)
Pt ambulatory to restroom without difficulty.

## 2016-04-27 NOTE — ED Notes (Signed)
Pt finished drinking oral contrast.

## 2016-04-27 NOTE — ED Notes (Signed)
Asked pt for urine specimen. Pt cannot go at this time. Pt will try after fluids is given.

## 2016-04-27 NOTE — ED Notes (Signed)
ED Provider at bedside. 

## 2016-04-27 NOTE — ED Triage Notes (Signed)
Patient complains of cramping abdominal pain that started this am with vomiting, denies diarrhea. States that the pain comes in waves, no vomiting on arrival.

## 2016-04-27 NOTE — ED Notes (Signed)
Per pt, pt HR 45-50 bpm per baseline

## 2016-10-29 ENCOUNTER — Ambulatory Visit: Payer: Self-pay | Admitting: Family Medicine

## 2017-12-12 ENCOUNTER — Emergency Department (HOSPITAL_COMMUNITY)

## 2017-12-12 ENCOUNTER — Encounter (HOSPITAL_COMMUNITY): Payer: Self-pay | Admitting: *Deleted

## 2017-12-12 ENCOUNTER — Other Ambulatory Visit: Payer: Self-pay

## 2017-12-12 ENCOUNTER — Observation Stay (HOSPITAL_COMMUNITY)
Admission: EM | Admit: 2017-12-12 | Discharge: 2017-12-13 | Disposition: A | Discharge status: Home / Self Care | Attending: Internal Medicine | Admitting: Internal Medicine

## 2017-12-12 DIAGNOSIS — D75A Glucose-6-phosphate dehydrogenase (G6PD) deficiency without anemia: Secondary | ICD-10-CM | POA: Insufficient documentation

## 2017-12-12 DIAGNOSIS — R569 Unspecified convulsions: Secondary | ICD-10-CM | POA: Diagnosis present

## 2017-12-12 HISTORY — DX: Anemia due to glucose-6-phosphate dehydrogenase (g6pd) deficiency: D55.0

## 2017-12-12 HISTORY — DX: Sickle-cell trait: D57.3

## 2017-12-12 HISTORY — DX: Anemia, unspecified: D64.9

## 2017-12-12 LAB — COMPREHENSIVE METABOLIC PANEL
ALK PHOS: 70 U/L (ref 38–126)
ALT: 13 U/L (ref 0–44)
AST: 21 U/L (ref 15–41)
Albumin: 4.7 g/dL (ref 3.5–5.0)
Anion gap: 10 (ref 5–15)
BILIRUBIN TOTAL: 2.1 mg/dL — AB (ref 0.3–1.2)
BUN: 10 mg/dL (ref 6–20)
CALCIUM: 9.4 mg/dL (ref 8.9–10.3)
CO2: 24 mmol/L (ref 22–32)
CREATININE: 0.81 mg/dL (ref 0.61–1.24)
Chloride: 107 mmol/L (ref 98–111)
Glucose, Bld: 107 mg/dL — ABNORMAL HIGH (ref 70–99)
Potassium: 4 mmol/L (ref 3.5–5.1)
Sodium: 141 mmol/L (ref 135–145)
Total Protein: 8.3 g/dL — ABNORMAL HIGH (ref 6.5–8.1)

## 2017-12-12 LAB — BLOOD GAS, VENOUS
ACID-BASE DEFICIT: 0.6 mmol/L (ref 0.0–2.0)
Bicarbonate: 24.1 mmol/L (ref 20.0–28.0)
O2 SAT: 66 %
PCO2 VEN: 42 mmHg — AB (ref 44.0–60.0)
PO2 VEN: 37.5 mmHg (ref 32.0–45.0)
Patient temperature: 98.6
pH, Ven: 7.376 (ref 7.250–7.430)

## 2017-12-12 LAB — CBC WITH DIFFERENTIAL/PLATELET
Abs Immature Granulocytes: 0.01 10*3/uL (ref 0.00–0.07)
BASOS ABS: 0 10*3/uL (ref 0.0–0.1)
Basophils Relative: 0 %
EOS ABS: 0 10*3/uL (ref 0.0–0.5)
EOS PCT: 0 %
HCT: 41.8 % (ref 39.0–52.0)
Hemoglobin: 14.5 g/dL (ref 13.0–17.0)
Immature Granulocytes: 0 %
Lymphocytes Relative: 27 %
Lymphs Abs: 1.3 10*3/uL (ref 0.7–4.0)
MCH: 30.6 pg (ref 26.0–34.0)
MCHC: 34.7 g/dL (ref 30.0–36.0)
MCV: 88.2 fL (ref 80.0–100.0)
Monocytes Absolute: 0.3 10*3/uL (ref 0.1–1.0)
Monocytes Relative: 6 %
NEUTROS PCT: 67 %
NRBC: 0 % (ref 0.0–0.2)
Neutro Abs: 3.3 10*3/uL (ref 1.7–7.7)
Platelets: 286 10*3/uL (ref 150–400)
RBC: 4.74 MIL/uL (ref 4.22–5.81)
RDW: 11.3 % — AB (ref 11.5–15.5)
WBC: 5 10*3/uL (ref 4.0–10.5)

## 2017-12-12 LAB — ETHANOL
ALCOHOL ETHYL (B): 135 mg/dL — AB (ref <10)
Alcohol, Ethyl (B): 56 mg/dL — ABNORMAL HIGH (ref <10)

## 2017-12-12 LAB — RAPID URINE DRUG SCREEN, HOSP PERFORMED
Amphetamines: NOT DETECTED
Barbiturates: NOT DETECTED
Benzodiazepines: NOT DETECTED
COCAINE: NOT DETECTED
OPIATES: NOT DETECTED
Tetrahydrocannabinol: NOT DETECTED

## 2017-12-12 LAB — I-STAT CHEM 8, ED
BUN: 9 mg/dL (ref 6–20)
CALCIUM ION: 1.17 mmol/L (ref 1.15–1.40)
CHLORIDE: 105 mmol/L (ref 98–111)
Creatinine, Ser: 1 mg/dL (ref 0.61–1.24)
GLUCOSE: 106 mg/dL — AB (ref 70–99)
HCT: 43 % (ref 39.0–52.0)
HEMOGLOBIN: 14.6 g/dL (ref 13.0–17.0)
Potassium: 4 mmol/L (ref 3.5–5.1)
Sodium: 142 mmol/L (ref 135–145)
TCO2: 28 mmol/L (ref 22–32)

## 2017-12-12 LAB — CBG MONITORING, ED: GLUCOSE-CAPILLARY: 105 mg/dL — AB (ref 70–99)

## 2017-12-12 MED ORDER — SODIUM CHLORIDE 0.9 % IV SOLN
INTRAVENOUS | Status: DC
  Administered 2017-12-12 (×2): via INTRAVENOUS

## 2017-12-12 MED ORDER — GADOBUTROL 1 MMOL/ML IV SOLN
10.0000 mL | Freq: Once | INTRAVENOUS | Status: AC | PRN
  Administered 2017-12-12: 10 mL via INTRAVENOUS

## 2017-12-12 MED ORDER — LORAZEPAM 2 MG/ML IJ SOLN
2.0000 mg | Freq: Once | INTRAMUSCULAR | Status: AC
  Administered 2017-12-12: 2 mg via INTRAVENOUS
  Filled 2017-12-12: qty 1

## 2017-12-12 MED ORDER — LEVETIRACETAM IN NACL 1000 MG/100ML IV SOLN
1000.0000 mg | Freq: Once | INTRAVENOUS | Status: AC
  Administered 2017-12-12: 1000 mg via INTRAVENOUS
  Filled 2017-12-12: qty 100

## 2017-12-12 MED ORDER — LORAZEPAM 2 MG/ML IJ SOLN: 1.0000 mg | INTRAMUSCULAR | Status: DC | PRN

## 2017-12-12 MED ORDER — ENOXAPARIN SODIUM 40 MG/0.4ML ~~LOC~~ SOLN
40.0000 mg | SUBCUTANEOUS | Status: DC
  Administered 2017-12-12: 40 mg via SUBCUTANEOUS
  Filled 2017-12-12: qty 0.4

## 2017-12-12 MED ORDER — LEVETIRACETAM IN NACL 500 MG/100ML IV SOLN
500.0000 mg | Freq: Two times a day (BID) | INTRAVENOUS | Status: DC
  Filled 2017-12-12 (×2): qty 100

## 2017-12-12 NOTE — Progress Notes (Signed)
Pt has arrived to Room 1512. Lethargic. Will open eyes to voice and is shaking head yes and no. Pt shaking his head "no" to pain. VS WNL. Mother in room.

## 2017-12-12 NOTE — H&P (Signed)
History and Physical    Justin Sheppard ZOX:[id number] DOB: 1996/02/17 DOA: 12/12/2017  PCP: Patient, No Pcp Per  Patient coming from: Home    Chief Complaint: Seizure  HPI: Justin Sheppard is a 22 y.o. male with no significant past medical history admitted with bystander witnessing seizure activity in the car.  No prior history of seizures no family history of seizures.  I discussed with his mom mom thinks he probably had some alcohol last night in a party he does not smoke cigarettes or use illegal drugs.  Patient is a Surveyor, minerals and works.  Mom said the last time she talked to him was last night at 6 PM when he was normal and then he went for a party.  She denied any fever chills chest pain shortness of breath cough nausea vomiting diarrhea.  He has a history of G6PD deficiency.  He takes no medications at home. ED Course: Patient received Ativan IV fluids start her on Keppra  Review of Systems: See above  Past Medical History:  Diagnosis Date  . Anemia   . G-6-PD variant enzyme deficiency anemia (HCC)   . Sickle cell trait [facility])     Past Surgical History:  Procedure Laterality Date  . WISDOM TOOTH EXTRACTION  2012     reports that he has never smoked. He has never used smokeless tobacco. He reports that he drinks alcohol. He reports that he does not use drugs.  Allergies  Allergen Reactions  . Aspirin Other (See Comments)    Unknown childhood allergic reaction  . Penicillins Other (See Comments)    Unknown childhood allergic reaction - no other information available Has patient had a PCN reaction causing immediate rash, facial/tongue/throat swelling, SOB or lightheadedness with hypotension: no Has patient had a PCN reaction causing severe rash involving mucus membranes or skin necrosis: no Has patient had a PCN reaction that required hospitalization: no Has patient had a PCN reaction occurring within the last 10 years: no  . Sulfa Antibiotics Other (See Comments)      Unknown childhood allergic reaction    No family history on file.  Prior to Admission medications   Not on File    Physical Exam: Vitals:   12/12/17 1200 12/12/17 1210 12/12/17 1220 12/12/17 1300  BP: 113/68  (!) 108/55 (!) 101/55  Pulse: 66 60 62 63  Resp: 18 14 15 15   SpO2: 100% 100% 100% 98%    Constitutional: NAD, calm, comfortable Vitals:   12/12/17 1200 12/12/17 1210 12/12/17 1220 12/12/17 1300  BP: 113/68  (!) 108/55 (!) 101/55  Pulse: 66 60 62 63  Resp: 18 14 15 15   SpO2: 100% 100% 100% 98%   Eyes: PERRL, lids and conjunctivae normal ENMT: Mucous membranes are moist. Posterior pharynx clear of any exudate or lesions.Normal dentition.  Neck: normal, supple, no masses, no thyromegaly Respiratory: clear to auscultation bilaterally, no wheezing, no crackles. Normal respiratory effort. No accessory muscle use.  Cardiovascular: Regular rate and rhythm, no murmurs / rubs / gallops. No extremity edema. 2+ pedal pulses. No carotid bruits.  Abdomen: no tenderness, no masses palpated. No hepatosplenomegaly. Bowel sounds positive.  Musculoskeletal: no clubbing / cyanosis. No joint deformity upper and lower extremities. Good ROM, no contractures. Normal muscle tone.  Skin: no rashes, lesions, ulcers. No induration Neurologic appears postictal response to sternal rub and calling his name opens his eyes upon calling his name loud and then goes back to sleep not able to follow a conversation. Labs on  Admission: I have personally reviewed following labs and imaging studies  CBC: Recent Labs  Lab 12/12/17 0853 12/12/17 0907  WBC 5.0  --   NEUTROABS 3.3  --   HGB 14.5 14.6  HCT 41.8 43.0  MCV 88.2  --   PLT 286  --    Basic Metabolic Panel: Recent Labs  Lab 12/12/17 0853 12/12/17 0907  NA 141 142  K 4.0 4.0  CL 107 105  CO2 24  --   GLUCOSE 107* 106*  BUN 10 9  CREATININE 0.81 1.00  CALCIUM 9.4  --    GFR: CrCl cannot be calculated (Unknown ideal  weight.). Liver Function Tests: Recent Labs  Lab 12/12/17 0853  AST 21  ALT 13  ALKPHOS 70  BILITOT 2.1*  PROT 8.3*  ALBUMIN 4.7   No results for input(s): LIPASE, AMYLASE in the last 168 hours. No results for input(s): AMMONIA in the last 168 hours. Coagulation Profile: No results for input(s): INR, PROTIME in the last 168 hours. Cardiac Enzymes: No results for input(s): CKTOTAL, CKMB, CKMBINDEX, TROPONINI in the last 168 hours. BNP (last 3 results) No results for input(s): PROBNP in the last 8760 hours. HbA1C: No results for input(s): HGBA1C in the last 72 hours. CBG: Recent Labs  Lab 12/12/17 0850  GLUCAP 105*   Lipid Profile: No results for input(s): CHOL, HDL, LDLCALC, TRIG, CHOLHDL, LDLDIRECT in the last 72 hours. Thyroid Function Tests: No results for input(s): TSH, T4TOTAL, FREET4, T3FREE, THYROIDAB in the last 72 hours. Anemia Panel: No results for input(s): VITAMINB12, FOLATE, FERRITIN, TIBC, IRON, RETICCTPCT in the last 72 hours. Urine analysis:    Component Value Date/Time   COLORURINE STRAW (A) 04/27/2016 1351   APPEARANCEUR CLEAR 04/27/2016 1351   LABSPEC 1.006 04/27/2016 1351   PHURINE 8.0 04/27/2016 1351   GLUCOSEU NEGATIVE 04/27/2016 1351   HGBUR NEGATIVE 04/27/2016 1351   BILIRUBINUR NEGATIVE 04/27/2016 1351   KETONESUR NEGATIVE 04/27/2016 1351   PROTEINUR NEGATIVE 04/27/2016 1351   UROBILINOGEN 2.0 (H) 11/29/2013 0142   NITRITE NEGATIVE 04/27/2016 1351   LEUKOCYTESUR NEGATIVE 04/27/2016 1351    Radiological Exams on Admission: Ct Head Wo Contrast  Result Date: 12/12/2017 CLINICAL DATA:  Altered level of consciousness. Alcohol intoxication. EXAM: CT HEAD WITHOUT CONTRAST TECHNIQUE: Contiguous axial images were obtained from the base of the skull through the vertex without intravenous contrast. COMPARISON:  None. FINDINGS: Brain: Nonspecific mild asymmetric prominence of a solitary sulcus within the left parietal lobe (image 15, series 6).  Gray-white differentiation is otherwise well maintained. No CT evidence of acute large territory infarct. No intraparenchymal or extra-axial mass or hemorrhage. Normal size and configuration of the ventricles and the basilar cisterns. No midline shift. Vascular: No hyperdense vessel or unexpected calcification. Skull: No displaced calvarial fracture. Sinuses/Orbits: Limited visualization of the paranasal sinuses and mastoid air cells is normal. No air-fluid levels. Other: Regional soft tissues appear normal. IMPRESSION: Nonspecific mild asymmetric prominence of a solitary sulcus within left parietal lobe. Otherwise, negative noncontrast head CT. Electronically Signed   By: Simonne Come M.D.   On: 12/12/2017 09:20   Mr [name] And Wo Contrast  Result Date: 12/12/2017 CLINICAL DATA:  22 year old male found unresponsive in vehicle. EXAM: MRI HEAD WITHOUT AND WITH CONTRAST TECHNIQUE: Multiplanar, multiecho pulse sequences of the brain and surrounding structures were obtained without and with intravenous contrast. CONTRAST:  10 milliliters Gadavist COMPARISON:  Head CT without contrast 0859 hours today. FINDINGS: Brain: No restricted diffusion to suggest acute infarction.  No midline shift, mass effect, evidence of mass lesion, ventriculomegaly, extra-axial collection or acute intracranial hemorrhage. Cervicomedullary junction and pituitary are within normal limits. Wallace Cullens and white matter signal appears normal throughout the brain. No edema, encephalomalacia, or chronic cerebral blood products. No abnormal enhancement identified.  No dural thickening. Vascular: Major intracranial vascular flow voids are preserved. The major dural venous sinuses are enhancing and appear patent. Skull and upper cervical spine: Negative visible cervical spine and spinal cord. Sclerotic 15 millimeter area in the right frontal bone appears benign on series 10, image 39. Bone marrow signal elsewhere is within normal limits. Sinuses/Orbits:  Disconjugate gaze, otherwise negative orbit soft tissues. Paranasal sinuses and mastoids are stable and well pneumatized. Other: Visible internal auditory structures appear normal. Scalp and face soft tissues appear negative. IMPRESSION: 1.  Normal MRI appearance of the brain. 2. Incidentally noted benign appearing 15 mm sclerotic area in the right frontal bone. Electronically Signed   By: Odessa Fleming M.D.   On: 12/12/2017 11:59      Assessment/Plan Active Problems:   * No active hospital problems. * #1 new onset seizure possibly triggered by alcohol intake last night mother reports he does not drink alcohol on a daily basis but does drink alcohol occasionally.  Denies use of IV drugs or illegal drugs or smoking.  Patient was given Keppra and Ativan.  Continue Keppra.  MRI and CT of the brain no acute changes.  Discussed with neurology Dr. Laurence Slate.  Consider EEG as an outpatient.  I have added urine drug screen and alcohol level.  #2 mildly elevated T bili follow-up tomorrow      DVT prophylaxis Lovenox Code Status: Full code Family Communication: Discussed with mother Disposition Plan: Pending clinical improvement Consults called: Neurology Admission status: Observation  Alwyn Ren MD [facility]  If 7PM-7AM, please contact night-coverage [email or web address] Password [email or web address]  12/12/2017, 2:01 PM

## 2017-12-12 NOTE — ED Notes (Signed)
ED TO INPATIENT HANDOFF REPORT  Name/Age/Gender Justin Sheppard 22 y.o. male  Code Status    Code Status Orders  (From admission, onward)         Start     Ordered   12/12/17 1417  Full code  Continuous     12/12/17 1416        Code Status History    This patient has a current code status but no historical code status.      Home/SNF/Other Home  Chief Complaint unresponsive  Level of Care/Admitting Diagnosis ED Disposition    ED Disposition Condition [facility]: [facility] [[id number]]  Level of Care: Med-Surg [16]  Diagnosis: Seizure [facility]) [[id number]]  Admitting Physician: Georgette Shell [[id number]]  Attending Physician: Georgette Shell [phone]  PT Class (Do Not Modify): Observation [104]  PT Acc Code (Do Not Modify): Observation [10022]       Medical History Past Medical History:  Diagnosis Date  . Anemia   . G-6-PD variant enzyme deficiency anemia ([place])   . Sickle cell trait (HCC)     Allergies Allergies  Allergen Reactions  . Aspirin Other (See Comments)    Unknown childhood allergic reaction  . Penicillins Other (See Comments)    Unknown childhood allergic reaction - no other information available Has patient had a PCN reaction causing immediate rash, facial/tongue/throat swelling, SOB or lightheadedness with hypotension: no Has patient had a PCN reaction causing severe rash involving mucus membranes or skin necrosis: no Has patient had a PCN reaction that required hospitalization: no Has patient had a PCN reaction occurring within the last 10 years: no  . Sulfa Antibiotics Other (See Comments)    Unknown childhood allergic reaction    IV Location/Drains/Wounds Patient Lines/Drains/Airways Status   Active Line/Drains/Airways    Name:   Placement date:   Placement time:   Site:   Days:   Peripheral IV 12/12/17   12/12/17    0856    -   less than 1          Labs/Imaging Results for orders  placed or performed during the hospital encounter of 12/12/17 (from the past 48 hour(s))  CBG monitoring, ED     Status: Abnormal   Collection Time: 12/12/17  8:50 AM  Result Value Ref Range   Glucose-Capillary 105 (H) 70 - 99 mg/dL  CBC with Differential     Status: Abnormal   Collection Time: 12/12/17  8:53 AM  Result Value Ref Range   WBC 5.0 4.0 - 10.5 K/uL   RBC 4.74 4.22 - 5.81 MIL/uL   Hemoglobin 14.5 13.0 - 17.0 g/dL   HCT 41.8 39.0 - 52.0 %   MCV 88.2 80.0 - 100.0 fL   MCH 30.6 26.0 - 34.0 pg   MCHC 34.7 30.0 - 36.0 g/dL   RDW 11.3 (L) 11.5 - 15.5 %   Platelets 286 150 - 400 K/uL   nRBC 0.0 0.0 - 0.2 %   Neutrophils Relative % 67 %   Neutro Abs 3.3 1.7 - 7.7 K/uL   Lymphocytes Relative 27 %   Lymphs Abs 1.3 0.7 - 4.0 K/uL   Monocytes Relative 6 %   Monocytes Absolute 0.3 0.1 - 1.0 K/uL   Eosinophils Relative 0 %   Eosinophils Absolute 0.0 0.0 - 0.5 K/uL   Basophils Relative 0 %   Basophils Absolute 0.0 0.0 - 0.1 K/uL   Immature Granulocytes 0 %   Abs  Immature Granulocytes 0.01 0.00 - 0.07 K/uL    Comment: Performed at [facility], [place] [address] [place], [place] [address]  Comprehensive metabolic panel     Status: Abnormal   Collection Time: 12/12/17  8:53 AM  Result Value Ref Range   Sodium 141 135 - 145 mmol/L   Potassium 4.0 3.5 - 5.1 mmol/L   Chloride 107 98 - 111 mmol/L   CO2 24 22 - 32 mmol/L   Glucose, Bld 107 (H) 70 - 99 mg/dL   BUN 10 6 - 20 mg/dL   Creatinine, Ser 0.81 0.61 - 1.24 mg/dL   Calcium 9.4 8.9 - 10.3 mg/dL   Total Protein 8.3 (H) 6.5 - 8.1 g/dL   Albumin 4.7 3.5 - 5.0 g/dL   AST 21 15 - 41 U/L   ALT 13 0 - 44 U/L   Alkaline Phosphatase 70 38 - 126 U/L   Total Bilirubin 2.1 (H) 0.3 - 1.2 mg/dL   GFR calc non Af Amer >60 >60 mL/min   GFR calc Af Amer >60 >60 mL/min    Comment: (NOTE) The eGFR has been calculated using the CKD EPI equation. This calculation has not been validated in all clinical situations. eGFR's  persistently <60 mL/min signify possible Chronic Kidney Disease.    Anion gap 10 5 - 15    Comment: Performed at [facility], [place] [address] [place], [place] [address]  Ethanol     Status: Abnormal   Collection Time: 12/12/17  8:55 AM  Result Value Ref Range   Alcohol, Ethyl (B) 135 (H) <10 mg/dL    Comment: (NOTE) Lowest detectable limit for serum alcohol is 10 mg/dL. For medical purposes only. Performed at [facility], [place] [address], [place], [place] [address]   Blood gas, venous     Status: Abnormal   Collection Time: 12/12/17  9:00 AM  Result Value Ref Range   pH, Ven 7.376 7.250 - 7.430   pCO2, Ven 42.0 (L) 44.0 - 60.0 mmHg   pO2, Ven 37.5 32.0 - 45.0 mmHg   Bicarbonate 24.1 20.0 - 28.0 mmol/L   Acid-base deficit 0.6 0.0 - 2.0 mmol/L   O2 Saturation 66.0 %   Patient temperature 98.6    Collection site VEIN    Drawn by COLLECTED BY NURSE    Sample type VENOUS     Comment: Performed at [facility] [address], [place],  [address]  I-stat chem 8, ed     Status: Abnormal   Collection Time: 12/12/17  9:07 AM  Result Value Ref Range   Sodium 142 135 - 145 mmol/L   Potassium 4.0 3.5 - 5.1 mmol/L   Chloride 105 98 - 111 mmol/L   BUN 9 6 - 20 mg/dL   Creatinine, Ser 1.00 0.61 - 1.24 mg/dL   Glucose, Bld 106 (H) 70 - 99 mg/dL   Calcium, Ion 1.17 1.15 - 1.40 mmol/L   TCO2 28 22 - 32 mmol/L   Hemoglobin 14.6 13.0 - 17.0 g/dL   HCT 43.0 39.0 - 52.0 %  Rapid urine drug screen (hospital performed)     Status: None   Collection Time: 12/12/17  9:37 AM  Result Value Ref Range   Opiates NONE DETECTED NONE DETECTED   Cocaine NONE DETECTED NONE DETECTED   Benzodiazepines NONE DETECTED NONE DETECTED   Amphetamines NONE DETECTED NONE DETECTED   Tetrahydrocannabinol NONE DETECTED NONE DETECTED   Barbiturates NONE DETECTED NONE  DETECTED    Comment: (NOTE) DRUG SCREEN FOR MEDICAL PURPOSES ONLY.  IF CONFIRMATION  IS NEEDED FOR ANY PURPOSE, NOTIFY LAB WITHIN 5 DAYS. LOWEST DETECTABLE LIMITS FOR URINE DRUG SCREEN Drug Class                     Cutoff (ng/mL) Amphetamine and metabolites    1000 Barbiturate and metabolites    200 Benzodiazepine                 093 Tricyclics and metabolites     300 Opiates and metabolites        300 Cocaine and metabolites        300 THC                            50 Performed at [facility], [place] [address] [place], [place] [address]    Ct Head Wo Contrast  Result Date: 12/12/2017 CLINICAL DATA:  Altered level of consciousness. Alcohol intoxication. EXAM: CT HEAD WITHOUT CONTRAST TECHNIQUE: Contiguous axial images were obtained from the base of the skull through the vertex without intravenous contrast. COMPARISON:  None. FINDINGS: Brain: Nonspecific mild asymmetric prominence of a solitary sulcus within the left parietal lobe (image 15, series 6). Gray-white differentiation is otherwise well maintained. No CT evidence of acute large territory infarct. No intraparenchymal or extra-axial mass or hemorrhage. Normal size and configuration of the ventricles and the basilar cisterns. No midline shift. Vascular: No hyperdense vessel or unexpected calcification. Skull: No displaced calvarial fracture. Sinuses/Orbits: Limited visualization of the paranasal sinuses and mastoid air cells is normal. No air-fluid levels. Other: Regional soft tissues appear normal. IMPRESSION: Nonspecific mild asymmetric prominence of a solitary sulcus within left parietal lobe. Otherwise, negative noncontrast head CT. Electronically Signed   By: Sandi Mariscal M.D.   On: 12/12/2017 09:20   Mr Jeri Cos And Wo Contrast  Result Date: 12/12/2017 CLINICAL DATA:  22 year old male found unresponsive in vehicle. EXAM: MRI HEAD WITHOUT AND WITH CONTRAST TECHNIQUE: Multiplanar, multiecho pulse sequences of the brain and surrounding structures were obtained without and with intravenous  contrast. CONTRAST:  10 milliliters Gadavist COMPARISON:  Head CT without contrast 0859 hours today. FINDINGS: Brain: No restricted diffusion to suggest acute infarction. No midline shift, mass effect, evidence of mass lesion, ventriculomegaly, extra-axial collection or acute intracranial hemorrhage. Cervicomedullary junction and pituitary are within normal limits. Pearline Cables and white matter signal appears normal throughout the brain. No edema, encephalomalacia, or chronic cerebral blood products. No abnormal enhancement identified.  No dural thickening. Vascular: Major intracranial vascular flow voids are preserved. The major dural venous sinuses are enhancing and appear patent. Skull and upper cervical spine: Negative visible cervical spine and spinal cord. Sclerotic 15 millimeter area in the right frontal bone appears benign on series 10, image 39. Bone marrow signal elsewhere is within normal limits. Sinuses/Orbits: Disconjugate gaze, otherwise negative orbit soft tissues. Paranasal sinuses and mastoids are stable and well pneumatized. Other: Visible internal auditory structures appear normal. Scalp and face soft tissues appear negative. IMPRESSION: 1.  Normal MRI appearance of the brain. 2. Incidentally noted benign appearing 15 mm sclerotic area in the right frontal bone. Electronically Signed   By: Genevie Ann M.D.   On: 12/12/2017 11:59   None  Pending Labs Unresulted Labs (From admission, onward)    Start     Ordered   12/13/17 0500  CBC  Tomorrow morning,  R     12/12/17 1419   12/13/17 0500  Comprehensive metabolic panel  Tomorrow morning,   R     12/12/17 1419   12/12/17 1418  Ethanol  Once,   R     12/12/17 1419   12/12/17 1417  Urine rapid drug screen (hosp performed)  STAT,   R     12/12/17 1419   12/12/17 1416  HIV antibody (Routine Testing)  Once,   R     12/12/17 1416          Vitals/Pain Today's Vitals   12/12/17 1210 12/12/17 1220 12/12/17 1300 12/12/17 1424  BP:  (!) 108/55 (!)  101/55 127/66  Pulse: 60 62 63 64  Resp: 14 15 15 17   SpO2: 100% 100% 98% 100%  PainSc:        Isolation Precautions No active isolations  Medications Medications  enoxaparin (LOVENOX) injection 40 mg (has no administration in time range)  levETIRAcetam (KEPPRA) IVPB 500 mg/100 mL premix (has no administration in time range)  0.9 %  sodium chloride infusion (has no administration in time range)  LORazepam (ATIVAN) injection 1 mg (has no administration in time range)  levETIRAcetam (KEPPRA) IVPB 1000 mg/100 mL premix (0 mg Intravenous Stopped 12/12/17 0945)  LORazepam (ATIVAN) injection 2 mg (2 mg Intravenous Given 12/12/17 0856)  gadobutrol (GADAVIST) 1 MMOL/ML injection 10 mL (10 mLs Intravenous Contrast Given 12/12/17 1136)    Mobility walks

## 2017-12-12 NOTE — ED Provider Notes (Signed)
[facility]-EMERGENCY DEPT Provider Note   CSN: [id number] Arrival date & time: 12/12/17  0847     History   Chief Complaint No chief complaint on file.   HPI Justin Sheppard is a 22 y.o. male.  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  No prior history of seizure disorder noted.  Patient possible use EtOH recently.  Presents by private vehicle and no further history obtainable.     No past medical history on file.  There are no active problems to display for this patient.   Past Surgical History:  Procedure Laterality Date  . WISDOM TOOTH EXTRACTION  2012        Home Medications    Prior to Admission medications   Medication Sig Start Date End Date Taking? Authorizing Provider  dicyclomine (BENTYL) 20 MG tablet Take 1 tablet (20 mg total) by mouth 2 (two) times daily. 04/27/16   Lorre Nick, MD    Family History No family history on file.  Social History Social History   Tobacco Use  . Smoking status: Never Smoker  . Smokeless tobacco: Never Used  Substance Use Topics  . Alcohol use: Yes    Comment: occasional  . Drug use: No     Allergies   Aspirin; Penicillins; and Sulfa antibiotics   Review of Systems Review of Systems  Unable to perform ROS: Patient unresponsive     Physical Exam Updated Vital Signs There were no vitals taken for this visit.  Physical Exam  Constitutional: He appears well-developed and well-nourished. He appears lethargic.  Non-toxic appearance. No distress.  HENT:  Head: Normocephalic and atraumatic.  Eyes: Pupils are equal, round, and reactive to light. Conjunctivae, EOM and lids are normal.  Neck: Normal range of motion. Neck supple. No tracheal deviation present. No thyroid mass present.  Cardiovascular: Normal rate, regular rhythm and normal heart sounds. Exam reveals no gallop.  No murmur heard. Pulmonary/Chest:  Effort normal and breath sounds normal. No stridor. No respiratory distress. He has no decreased breath sounds. He has no wheezes. He has no rhonchi. He has no rales.  Abdominal: Soft. Normal appearance and bowel sounds are normal. He exhibits no distension. There is no tenderness. There is no rebound and no CVA tenderness.  Musculoskeletal: Normal range of motion. He exhibits no edema or tenderness.  Neurological: He appears lethargic. He displays seizure activity. GCS eye subscore is 1. GCS verbal subscore is 2. GCS motor subscore is 4.  Patient with focalized seizure on the left upper and left lower extremities.  Eyes are deviated to the left.  Skin: Skin is warm and dry. No abrasion and no rash noted.  Psychiatric: He has a normal mood and affect. His speech is normal and behavior is normal.  Nursing note and vitals reviewed.    ED Treatments / Results  Labs (all labs ordered are listed, but only abnormal results are displayed) Labs Reviewed  CBG MONITORING, ED - Abnormal; Notable for the following components:      Result Value   Glucose-Capillary 105 (*)    All other components within normal limits  CBC WITH DIFFERENTIAL/PLATELET  RAPID URINE DRUG SCREEN, HOSP PERFORMED  BLOOD GAS, VENOUS  COMPREHENSIVE METABOLIC PANEL  ETHANOL    EKG None  Radiology No results found.  Procedures Procedures (including critical care time)  Medications Ordered in ED Medications  levETIRAcetam (KEPPRA) IVPB 1000 mg/100 mL premix (has no administration in  time range)  LORazepam (ATIVAN) injection 2 mg (2 mg Intravenous Given 12/12/17 0856)     Initial Impression / Assessment and Plan / ED Course  I have reviewed the triage vital signs and the nursing notes.  Pertinent labs & imaging results that were available during my care of the patient were reviewed by me and considered in my medical decision making (see chart for details).    9:32 AM  Patient given Ativan 2 mg IV push and  seizures have improved.  Patient appears to be cold and given blankets.  He is afebrile here with a rectal temp of 97.2.  Patient had Keppra ordered because of the focality of his seizures.  Had a head CT which did not show any intracranial hemorrhage.  Case discussed with neuro hospitalist who recommended patient have MRI of brain.  That is pending at this time  1:16 PM Patient rechecked multiple times.  He is now more alert and interactive with his family.  No seizure activity noted.  Patient MRI of his brain without acute findings.  Case discussed with neuro hospitalist on-call again he recommends admission and likely EEG.  Will consult hospitalist for admission.   CRITICAL CARE Performed by: Toy Baker Total critical care time: 75 minutes Critical care time was exclusive of separately billable procedures and treating other patients. Critical care was necessary to treat or prevent imminent or life-threatening deterioration. Critical care was time spent personally by me on the following activities: development of treatment plan with patient and/or surrogate as well as nursing, discussions with consultants, evaluation of patient's response to treatment, examination of patient, obtaining history from patient or surrogate, ordering and performing treatments and interventions, ordering and review of laboratory studies, ordering and review of radiographic studies, pulse oximetry and re-evaluation of patient's condition.   Final Clinical Impressions(s) / ED Diagnoses   Final diagnoses:  None    ED Discharge Orders    None       Lorre Nick, MD 12/12/17 1317

## 2017-12-12 NOTE — ED Notes (Signed)
MRI will be in [facility] to transport patient to MRI within 15-20 minutes.

## 2017-12-12 NOTE — ED Notes (Signed)
Bed: WA16 Expected date:  Expected time:  Means of arrival:  Comments: Hold for [facility] A 

## 2017-12-12 NOTE — ED Notes (Signed)
Pt arrives to Res[facility] A from triage unresponsive. Pt observed to be posturing followed by seizure.

## 2017-12-12 NOTE — ED Notes (Addendum)
Patient transported to MRI escorted by Lucrezia Europe, RN per MRI tech d/t pt acuity

## 2017-12-12 NOTE — Progress Notes (Signed)
I had a brief visit w/pt's mom who was bedside. She said she was okay. I offered to hydration or any other assistance. She said she was fine and that he (her son) is going to be okay. Please page if there are any changes or further assistance is needed. Chaplain Marjory Lies, MDiv   12/12/17 1200  Clinical Encounter Type  Visited With Family     12/12/17 1200  Clinical Encounter Type  Visited With Family

## 2017-12-12 NOTE — ED Triage Notes (Signed)
Pt arrives in pvt vehicle unresponsive. Mother states pt was found in his care not responding.

## 2017-12-12 NOTE — ED Notes (Signed)
Pt sleeping soundly with even, unlabored resp. Pt family at bedside. Pt VSS

## 2017-12-12 NOTE — Progress Notes (Signed)
Pt alert & oriented X4. Pt pulling tape off IV stating "I want to go home." Family and friends at bedside stating that "this isn't him, he's not in his right mind." RN explained why he should stay for observation. ON call MD paged about pt potentially leaving AMA. Again pt is AOx4, we cannot hold the pt here against his wishes.  Pt is now agreeable to stay for the night. Pt is eating regular diet. Pt Dad said he would stay with the pt tonight.  Justin Mend, RN

## 2017-12-12 NOTE — Progress Notes (Signed)
Pt refused Keppra. Pt educated. O2 set up, suction kit set up at bedside. Seizure pads are not available, portable utilities aware of the need for pt. Will continue to monitor.  Kizzie Ide, RN

## 2017-12-12 NOTE — Consult Note (Signed)
Requesting Physician: Dr. Freida Busman    Chief Complaint: Seizure  History obtained from: Patient and Chart     HPI:                                                                                                                                       Justin Sheppard is an 22 y.o. male with past medical history of G6PD deficiency presents after being found seizing in his car earlier today.  On arrival to [facility] long emergency room patient found to be posturing, noted to have left gaze deviation and jerking in the left upper and lower extremities.  Resolved after receiving 2 mg of Ativan.  Was loaded with 1 g of Keppra in the emergency room.  Remained postictal and was admitted for observation.  He underwent MRI brain showed no abnormality, focus for seizures.  Patient apparently was parting the last night and had excessive alcohol.  His alcohol level was 135 this morning.  Electrolytes are within normal limits.  U tox screen was negative and patient denies any illicit substance abuse.  On assessment this evening, patient is back to his baseline.    Past Medical History:  Diagnosis Date  . Anemia   . G-6-PD variant enzyme deficiency anemia (HCC)   . Sickle cell trait [facility])     Past Surgical History:  Procedure Laterality Date  . WISDOM TOOTH EXTRACTION  2012    No family history on file. Social History:  reports that he has never smoked. He has never used smokeless tobacco. He reports that he drinks alcohol. He reports that he does not use drugs.  Allergies:  Allergies  Allergen Reactions  . Aspirin Other (See Comments)    Unknown childhood allergic reaction  . Penicillins Other (See Comments)    Unknown childhood allergic reaction - no other information available Has patient had a PCN reaction causing immediate rash, facial/tongue/throat swelling, SOB or lightheadedness with hypotension: no Has patient had a PCN reaction causing severe rash involving mucus membranes or skin necrosis:  no Has patient had a PCN reaction that required hospitalization: no Has patient had a PCN reaction occurring within the last 10 years: no  . Sulfa Antibiotics Other (See Comments)    Unknown childhood allergic reaction    Medications:  I reviewed home medications   ROS:                                                                                                                                     14 systems reviewed and negative except above    Examination:                                                                                                      General: Appears well-developed and well-nourished.  Psych: Affect appropriate to situation Eyes: No scleral injection HENT: No OP obstrucion Head: Normocephalic.  Cardiovascular: Normal rate and regular rhythm.  Respiratory: Effort normal and breath sounds normal to anterior ascultation GI: Soft.  No distension. There is no tenderness.  Skin: WDI    Neurological Examination Mental Status: Slightly drowsy,  oriented, thought content appropriate.  Speech fluent without evidence of aphasia. Able to follow 3 step commands without difficulty. Cranial Nerves: II: Visual fields grossly normal,  III,IV, VI: ptosis not present, extra-ocular motions intact bilaterally, pupils equal, round, reactive to light and accommodation V,VII: smile symmetric, facial light touch sensation normal bilaterally VIII: hearing normal bilaterally IX,X: uvula rises symmetrically XI: bilateral shoulder shrug XII: midline tongue extension Motor: Right : Upper extremity   5/5    Left:     Upper extremity   5/5  Lower extremity   5/5     Lower extremity   5/5 Tone and bulk:normal tone throughout; no atrophy noted Sensory: Pinprick and light touch intact throughout, bilaterally Deep Tendon Reflexes: 2+ and symmetric  throughout Plantars: Right: downgoing   Left: downgoing Cerebellar: normal finger-to-nose, normal rapid alternating movements and normal heel-to-shin test Gait: normal gait and station     Lab Results: Basic Metabolic Panel: Recent Labs  Lab 12/12/17 0853 12/12/17 0907  NA 141 142  K 4.0 4.0  CL 107 105  CO2 24  --   GLUCOSE 107* 106*  BUN 10 9  CREATININE 0.81 1.00  CALCIUM 9.4  --     CBC: Recent Labs  Lab 12/12/17 0853 12/12/17 0907  WBC 5.0  --   NEUTROABS 3.3  --   HGB 14.5 14.6  HCT 41.8 43.0  MCV 88.2  --   PLT 286  --     Coagulation Studies: No results for input(s): LABPROT, INR in the last 72 hours.  Imaging: Ct Head Wo Contrast  Result Date: 12/12/2017 CLINICAL DATA:  Altered level of consciousness. Alcohol intoxication. EXAM: CT HEAD WITHOUT  CONTRAST TECHNIQUE: Contiguous axial images were obtained from the base of the skull through the vertex without intravenous contrast. COMPARISON:  None. FINDINGS: Brain: Nonspecific mild asymmetric prominence of a solitary sulcus within the left parietal lobe (image 15, series 6). Gray-white differentiation is otherwise well maintained. No CT evidence of acute large territory infarct. No intraparenchymal or extra-axial mass or hemorrhage. Normal size and configuration of the ventricles and the basilar cisterns. No midline shift. Vascular: No hyperdense vessel or unexpected calcification. Skull: No displaced calvarial fracture. Sinuses/Orbits: Limited visualization of the paranasal sinuses and mastoid air cells is normal. No air-fluid levels. Other: Regional soft tissues appear normal. IMPRESSION: Nonspecific mild asymmetric prominence of a solitary sulcus within left parietal lobe. Otherwise, negative noncontrast head CT. Electronically Signed   By: Simonne Come M.D.   On: 12/12/2017 09:20   Mr [name] And Wo Contrast  Result Date: 12/12/2017 CLINICAL DATA:  22 year old male found unresponsive in vehicle. EXAM: MRI  HEAD WITHOUT AND WITH CONTRAST TECHNIQUE: Multiplanar, multiecho pulse sequences of the brain and surrounding structures were obtained without and with intravenous contrast. CONTRAST:  10 milliliters Gadavist COMPARISON:  Head CT without contrast 0859 hours today. FINDINGS: Brain: No restricted diffusion to suggest acute infarction. No midline shift, mass effect, evidence of mass lesion, ventriculomegaly, extra-axial collection or acute intracranial hemorrhage. Cervicomedullary junction and pituitary are within normal limits. Wallace Cullens and white matter signal appears normal throughout the brain. No edema, encephalomalacia, or chronic cerebral blood products. No abnormal enhancement identified.  No dural thickening. Vascular: Major intracranial vascular flow voids are preserved. The major dural venous sinuses are enhancing and appear patent. Skull and upper cervical spine: Negative visible cervical spine and spinal cord. Sclerotic 15 millimeter area in the right frontal bone appears benign on series 10, image 39. Bone marrow signal elsewhere is within normal limits. Sinuses/Orbits: Disconjugate gaze, otherwise negative orbit soft tissues. Paranasal sinuses and mastoids are stable and well pneumatized. Other: Visible internal auditory structures appear normal. Scalp and face soft tissues appear negative. IMPRESSION: 1.  Normal MRI appearance of the brain. 2. Incidentally noted benign appearing 15 mm sclerotic area in the right frontal bone. Electronically Signed   By: Odessa Fleming M.D.   On: 12/12/2017 11:59     I have reviewed the above imaging  MRI brain is normal.   ASSESSMENT AND PLAN   Seizures, likely provoked in the setting of alcohol and reduced sleep  Plan Although I do not start AED for first time seizure, particular when provoked - I do think it would be safer to have him on Keppra 500 mg twice daily,  given he may have had multiple seizures-noted to be seizing in the car and also appears to have had  another seizure in the emergency department.  In addition, while seizure are rare in G6PD deficiency, it could be additionally dangerous for him.     Discussed with family that he will need an EEG that will likely be done outpatient, given that it is the weekend.  IF EEG is negative patient remains seizure-free, I think we no longer need to continue his antiepileptic agent.  Follow-up with neurologist as an outpatient. No driving to 6 months, seizure precautions Counseled to avoid alcohol   Per [facility] statutes, patients with seizures are not allowed to drive until they have been seizure-free for six months. Use caution when using heavy equipment or power tools. Avoid working on ladders or at heights. Take showers instead of baths. Ensure  the water temperature is not too high on the home water heater. Do not go swimming alone. Do not lock yourself in a room alone (i.e. bathroom). When caring for infants or small children, sit down when holding, feeding, or changing them to minimize risk of injury to the child in the event you have a seizure. Maintain good sleep hygiene. Avoid alcohol.    If Justin Sheppard has another seizure, call 911 and bring them back to the ED if:       A.  The seizure lasts longer than 5 minutes.            B.  The patient doesn't wake shortly after the seizure or has new problems such as difficulty seeing, speaking or moving following the seizure       C.  The patient was injured during the seizure       D.  The patient has a temperature over 102 F (39C)       E.  The patient vomited during the seizure and now is having trouble breathing   Cayce Paschal [facility] Pager Number [phone]

## 2017-12-13 LAB — COMPREHENSIVE METABOLIC PANEL
ALBUMIN: 3.5 g/dL (ref 3.5–5.0)
ALK PHOS: 57 U/L (ref 38–126)
ALT: 11 U/L (ref 0–44)
AST: 16 U/L (ref 15–41)
Anion gap: 6 (ref 5–15)
BILIRUBIN TOTAL: 0.9 mg/dL (ref 0.3–1.2)
BUN: 12 mg/dL (ref 6–20)
CALCIUM: 8.8 mg/dL — AB (ref 8.9–10.3)
CO2: 27 mmol/L (ref 22–32)
Chloride: 110 mmol/L (ref 98–111)
Creatinine, Ser: 0.96 mg/dL (ref 0.61–1.24)
GFR calc Af Amer: >60 mL/min (ref >60)
GFR calc non Af Amer: >60 mL/min (ref >60)
GLUCOSE: 90 mg/dL (ref 70–99)
Potassium: 3.7 mmol/L (ref 3.5–5.1)
Sodium: 143 mmol/L (ref 135–145)
TOTAL PROTEIN: 6.3 g/dL — AB (ref 6.5–8.1)

## 2017-12-13 LAB — CBC
HEMATOCRIT: 37.7 % — AB (ref 39.0–52.0)
HEMOGLOBIN: 12.7 g/dL — AB (ref 13.0–17.0)
MCH: 30.5 pg (ref 26.0–34.0)
MCHC: 33.7 g/dL (ref 30.0–36.0)
MCV: 90.6 fL (ref 80.0–100.0)
NRBC: 0 % (ref 0.0–0.2)
Platelets: 245 10*3/uL (ref 150–400)
RBC: 4.16 MIL/uL — AB (ref 4.22–5.81)
RDW: 11.2 % — ABNORMAL LOW (ref 11.5–15.5)
WBC: 3.7 10*3/uL — AB (ref 4.0–10.5)

## 2017-12-13 LAB — HIV ANTIBODY (ROUTINE TESTING W REFLEX): HIV Screen 4th Generation wRfx: NONREACTIVE

## 2017-12-13 MED ORDER — LEVETIRACETAM 500 MG PO TABS: 500.0000 mg | ORAL_TABLET | Freq: Two times a day (BID) | ORAL | 2 refills | Status: DC
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
## 2017-12-13 NOTE — Progress Notes (Signed)
Pt leaving this morning with his mother. Alert, oriented, and without c/o. Discharge instructions/prescription given/explained with pt verbalizing understanding.  Pt and mother aware of followup with Neurology.

## 2017-12-14 ENCOUNTER — Encounter (HOSPITAL_COMMUNITY): Payer: Self-pay | Admitting: Emergency Medicine

## 2017-12-14 ENCOUNTER — Emergency Department (HOSPITAL_COMMUNITY)
Admission: EM | Admit: 2017-12-14 | Discharge: 2017-12-14 | Disposition: A | Discharge status: Home / Self Care | Attending: Emergency Medicine | Admitting: Emergency Medicine

## 2017-12-14 ENCOUNTER — Ambulatory Visit (HOSPITAL_COMMUNITY)
Admission: EM | Admit: 2017-12-14 | Discharge: 2017-12-14 | Disposition: A | Discharge status: Home / Self Care | Payer: No Typology Code available for payment source | Source: Ambulatory Visit | Attending: Emergency Medicine | Admitting: Emergency Medicine

## 2017-12-14 DIAGNOSIS — Z202 Contact with and (suspected) exposure to infections with a predominantly sexual mode of transmission: Secondary | ICD-10-CM

## 2017-12-14 DIAGNOSIS — Z0441 Encounter for examination and observation following alleged adult rape: Secondary | ICD-10-CM | POA: Diagnosis present

## 2017-12-14 MED ORDER — AZITHROMYCIN 250 MG PO TABS
1000.0000 mg | ORAL_TABLET | Freq: Once | ORAL | Status: AC
  Administered 2017-12-14: 1000 mg via ORAL
  Filled 2017-12-14: qty 4

## 2017-12-14 MED ORDER — CEFTRIAXONE SODIUM 250 MG IJ SOLR
250.0000 mg | Freq: Once | INTRAMUSCULAR | Status: AC
  Administered 2017-12-14: 250 mg via INTRAMUSCULAR
  Filled 2017-12-14: qty 250

## 2017-12-14 MED ORDER — STERILE WATER FOR INJECTION IJ SOLN
INTRAMUSCULAR | Status: AC
  Administered 2017-12-14: 10 mL
  Filled 2017-12-14: qty 10
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
## 2017-12-15 ENCOUNTER — Encounter: Payer: Self-pay | Admitting: Neurology

## 2017-12-15 ENCOUNTER — Telehealth: Payer: Self-pay | Admitting: Neurology

## 2017-12-15 NOTE — Telephone Encounter (Signed)
Patient was a ED referral. I scheduled him for first available 03/02/18. He wanted to know if he could get a note for short term disability until his appt. He is having a lot of seizures. IS this possible. Call back number is [phone]. Thanks!

## 2017-12-15 NOTE — Telephone Encounter (Signed)
Patient is now scheduled for Friday at 1pm. Thanks!

## 2017-12-15 NOTE — Telephone Encounter (Signed)
Can he come Friday 12/18/17 @ 1pm?  I know this is for Urgent appointments, but I don't think Dr. Karel Jarvis would feel comfortable filling out disability paperwork without seeing pt first.

## 2017-12-16 NOTE — Telephone Encounter (Signed)
Just wanted to let you know why I had pt scheduled in your Urgent Work-in Slot.

## 2017-12-16 NOTE — Telephone Encounter (Signed)
Ok, thanks.

## 2017-12-18 ENCOUNTER — Ambulatory Visit (INDEPENDENT_AMBULATORY_CARE_PROVIDER_SITE_OTHER): Admitting: Neurology

## 2017-12-18 ENCOUNTER — Encounter: Payer: Self-pay | Admitting: Neurology

## 2017-12-18 VITALS — BP 118/76 | HR 70 | Ht 73.0 in | Wt 191.0 lb

## 2017-12-18 DIAGNOSIS — R569 Unspecified convulsions: Secondary | ICD-10-CM
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
## 2017-12-21 ENCOUNTER — Ambulatory Visit (INDEPENDENT_AMBULATORY_CARE_PROVIDER_SITE_OTHER): Admitting: Neurology

## 2017-12-21 DIAGNOSIS — R569 Unspecified convulsions: Secondary | ICD-10-CM | POA: Diagnosis not present

## 2017-12-22 NOTE — Procedures (Signed)
ELECTROENCEPHALOGRAM REPORT  Date of Study: 12/21/2017  Patient's Name: Justin Sheppard MRN: [id number] Date of Birth: 06/01/1995  Referring Provider: Dr. Patrcia Dolly  Clinical History: This is a 22 year old man with new onset seizure, he had at least 2 in one day, with report of left gaze deviation and left-sided shaking.  Medications: Keppra  Technical Summary: A multichannel digital 1-hour EEG recording measured by the international 10-20 system with electrodes applied with paste and impedances below 5000 ohms performed in our laboratory with EKG monitoring in an awake and asleep patient.  Hyperventilation was not performed. Photic stimulation was performed.  The digital EEG was referentially recorded, reformatted, and digitally filtered in a variety of bipolar and referential montages for optimal display.    Description: The patient is awake and asleep during the recording.  During maximal wakefulness, there is a symmetric, medium voltage 10 Hz posterior dominant rhythm that attenuates with eye opening.  The record is symmetric.  During drowsiness and sleep, there is an increase in theta slowing of the background.  Vertex waves and symmetric sleep spindles were seen.  Photic stimulation did not elicit any abnormalities.  There were no epileptiform discharges or electrographic seizures seen.    EKG lead was unremarkable.  Impression: This 1-hour awake and asleep EEG is normal.    Clinical Correlation: A normal EEG does not exclude a clinical diagnosis of epilepsy.  If further clinical questions remain, prolonged EEG may be helpful.  Clinical correlation is advised.   Patrcia Dolly, M.D.

## 2017-12-24 ENCOUNTER — Telehealth: Payer: Self-pay

## 2017-12-24 DIAGNOSIS — R569 Unspecified convulsions: Secondary | ICD-10-CM

## 2017-12-24 NOTE — Telephone Encounter (Signed)
Spoke with pt relaying message below.  He asks if this means that he still needs to wait for Dr. Karel Jarvis to fill out FMLA/Disability forms (pls see previous phone encounter)  I advised that Dr. Karel Jarvis is out of the office until November 18th, but that I would send message and if another Dr. responds, I will return call with answer.

## 2017-12-24 NOTE — Telephone Encounter (Signed)
Yes.  He will need to wait.  None of the other docs will be able to fill that out.  She will need to decide if that is appropriate

## 2017-12-24 NOTE — Telephone Encounter (Signed)
-----   Message from Van Clines, MD sent at 12/22/2017  4:40 PM EST ----- Pls let him know the EEG was normal, however it is not like a pregnancy test that is positive or negative. I would like to do a 24-hour EEG (pls explain the study to him) to see if we can completely get him off the Keppra. Continue Keppra 500mg  qhs for now until the 24-hour EEG, thanks

## 2017-12-24 NOTE — Telephone Encounter (Signed)
Order placed for 24HR AMBULATORY EEG.  Message sent to Darl Pikes to contact pt to schedule

## 2017-12-29 ENCOUNTER — Telehealth: Payer: Self-pay | Admitting: *Deleted

## 2017-12-29 NOTE — Telephone Encounter (Signed)
Unable to leave a message, called to set up appointment for 24 hour ambulatory EEG.

## 2018-01-04 ENCOUNTER — Other Ambulatory Visit: Payer: Self-pay

## 2018-01-04 ENCOUNTER — Emergency Department (HOSPITAL_COMMUNITY)
Admission: EM | Admit: 2018-01-04 | Discharge: 2018-01-05 | Disposition: A | Discharge status: Home / Self Care | Attending: Emergency Medicine | Admitting: Emergency Medicine

## 2018-01-04 DIAGNOSIS — Z9114 Patient's other noncompliance with medication regimen: Secondary | ICD-10-CM | POA: Insufficient documentation

## 2018-01-04 DIAGNOSIS — R569 Unspecified convulsions: Secondary | ICD-10-CM | POA: Diagnosis present

## 2018-01-04 LAB — CBC WITH DIFFERENTIAL/PLATELET
Abs Immature Granulocytes: 0.01 10*3/uL (ref 0.00–0.07)
BASOS ABS: 0 10*3/uL (ref 0.0–0.1)
Basophils Relative: 1 %
EOS ABS: 0.1 10*3/uL (ref 0.0–0.5)
Eosinophils Relative: 2 %
HEMATOCRIT: 41.1 % (ref 39.0–52.0)
HEMOGLOBIN: 13.7 g/dL (ref 13.0–17.0)
IMMATURE GRANULOCYTES: 0 %
LYMPHS ABS: 2.1 10*3/uL (ref 0.7–4.0)
LYMPHS PCT: 50 %
MCH: 31 pg (ref 26.0–34.0)
MCHC: 33.3 g/dL (ref 30.0–36.0)
MCV: 93 fL (ref 80.0–100.0)
Monocytes Absolute: 0.4 10*3/uL (ref 0.1–1.0)
Monocytes Relative: 10 %
NEUTROS ABS: 1.5 10*3/uL — AB (ref 1.7–7.7)
NEUTROS PCT: 37 %
NRBC: 0 % (ref 0.0–0.2)
Platelets: 251 10*3/uL (ref 150–400)
RBC: 4.42 MIL/uL (ref 4.22–5.81)
RDW: 10.9 % — AB (ref 11.5–15.5)
WBC: 4.1 10*3/uL (ref 4.0–10.5)

## 2018-01-04 LAB — BASIC METABOLIC PANEL
ANION GAP: 6 (ref 5–15)
BUN: 14 mg/dL (ref 6–20)
CHLORIDE: 106 mmol/L (ref 98–111)
CO2: 28 mmol/L (ref 22–32)
Calcium: 8.9 mg/dL (ref 8.9–10.3)
Creatinine, Ser: 0.94 mg/dL (ref 0.61–1.24)
GFR calc non Af Amer: >60 mL/min (ref >60)
Glucose, Bld: 86 mg/dL (ref 70–99)
POTASSIUM: 4 mmol/L (ref 3.5–5.1)
Sodium: 140 mmol/L (ref 135–145)

## 2018-01-04 LAB — RAPID URINE DRUG SCREEN, HOSP PERFORMED
Amphetamines: NOT DETECTED
BENZODIAZEPINES: NOT DETECTED
Barbiturates: NOT DETECTED
COCAINE: NOT DETECTED
Opiates: NOT DETECTED
Tetrahydrocannabinol: NOT DETECTED

## 2018-01-04 LAB — URINALYSIS, DIPSTICK ONLY
Bilirubin Urine: NEGATIVE
Glucose, UA: NEGATIVE mg/dL
Hgb urine dipstick: NEGATIVE
KETONES UR: NEGATIVE mg/dL
LEUKOCYTES UA: NEGATIVE
NITRITE: NEGATIVE
PH: 6 (ref 5.0–8.0)
Protein, ur: NEGATIVE mg/dL
Specific Gravity, Urine: 1.019 (ref 1.005–1.030)

## 2018-01-04 LAB — CBG MONITORING, ED: Glucose-Capillary: 80 mg/dL (ref 70–99)

## 2018-01-04 MED ORDER — LEVETIRACETAM 500 MG PO TABS: 500.0000 mg | ORAL_TABLET | Freq: Every day | ORAL | 2 refills | Status: DC

## 2018-01-04 NOTE — Discharge Instructions (Addendum)
I don't think today's event was a seizure. Your work-up and physical exam today was normal. According to your neurologist, Dr. Karel JarvisAquino, you are supposed to take the Keppra once a day at bedtime. You may start that tomorrow.  Be sure to call Dr. Rosalyn GessAquino's office tomorrow and schedule a sooner follow-up appointment. Follow-up in the ED if you have another seizure.  Thank you for allowing us to take care of you today.

## 2018-01-04 NOTE — ED Provider Notes (Signed)
[facility]-EMERGENCY DEPT Provider Note  CSN: [id number] Arrival date & time: 01/04/18  1758    History   Chief Complaint Chief Complaint  Patient presents with  . Seizures    HPI Justin Sheppard is a 22 y.o. male with a medical history of seizure disorder and G6PD deficiency anemia who presented to the ED with concerns of seizure. Patient states that he was sleeping and describes "feeling like I was day dreaming." Patient unable to tell if he was sleep or fully awake, but states that when he tried to move his body he could not and then all of a sudden he was able to get up. This whole episode lasted < 2 minutes. This was not witnessed. Patient states that he has not had a seizure since late October and has been intermittently taking Keppra which is prescribed by neurology.  Additional history obtained by neurology who he saw on 11/1/9. Patient has strict seizure precautions and scheduled EEG for further evaluation.  Past Medical History:  Diagnosis Date  . Anemia   . G-6-PD variant enzyme deficiency anemia (HCC)   . Sickle cell trait [facility](HCC)     Patient Active Problem List   Diagnosis Date Noted  . Seizure (HCC) 12/12/2017    Past Surgical History:  Procedure Laterality Date  . WISDOM TOOTH EXTRACTION  2012        Home Medications    Prior to Admission medications   Medication Sig Start Date End Date Taking? Authorizing Provider  levETIRAcetam (KEPPRA) 500 MG tablet Take 1 tablet (500 mg total) by mouth 2 (two) times daily. 12/13/17  Yes Marinda ElkFeliz Ortiz, Abraham, MD    Family History No family history on file.  Social History Social History   Tobacco Use  . Smoking status: Never Smoker  . Smokeless tobacco: Never Used  Substance Use Topics  . Alcohol use: Yes  . Drug use: No     Allergies   Aspirin; Penicillins; and Sulfa antibiotics   Review of Systems Review of Systems  Neurological: Positive for seizures.     Physical  Exam Updated Vital Signs BP (!) 164/68 (BP Location: Left Arm)   Pulse 63   Temp 98.2 F (36.8 C) (Oral)   Resp 18   Ht 6\' 1"  (1.854 m)   Wt 86.2 kg   SpO2 100%   BMI 25.07 kg/m   Physical Exam   ED Treatments / Results  Labs (all labs ordered are listed, but only abnormal results are displayed) Labs Reviewed  LEVETIRACETAM LEVEL  BASIC METABOLIC PANEL  CBC WITH DIFFERENTIAL/PLATELET  URINALYSIS, DIPSTICK ONLY  RAPID URINE DRUG SCREEN, HOSP PERFORMED  CBG MONITORING, ED    EKG None  Radiology No results found.  Procedures Procedures (including critical care time)  Medications Ordered in ED Medications - No data to display   Initial Impression / Assessment and Plan / ED Course  Triage vital signs and the nursing notes have been reviewed.  Pertinent labs & imaging results that were available during care of the patient were reviewed and considered in medical decision making (see chart for details).  Patient presents with concerns of seizure. He reports intermittent compliance with Keppra due to side effects. History from today is not consistent with a seizure. Physical exam is unremarkable and there are no focal neuro deficits on exam. No indication for brain imaging at this time. Patient last had brain imaging on 12/12/17 and was seen by neurology on 12/18/17. Will order basic labs  and send out for Keppra level. Clinical Course as of Jan 05 2319  Mon Jan 04, 2018  2319 Labs unremarkable. Keppra level is send out and will not return tonight.   [GM]    Clinical Course User Index [GM] Mortis, Sharyon Medicus, PA-C   Final Clinical Impressions(s) / ED Diagnoses  1. Seizure-like Activity. Advised to follow-up with neurology. Advised to follow-up to the ED if another seizure happens. Per neurology note, patient is supposed to be taking Keppra 500mg  QHS and patient was instructed to do this.  Dispo: Home. After thorough clinical evaluation, this patient is determined to  be medically stable and can be safely discharged with the previously mentioned treatment and/or outpatient follow-up/referral(s). At this time, there are no other apparent medical conditions that require further screening, evaluation or treatment.   Final diagnoses:  Seizure-like activity [facility])    ED Discharge Orders         Ordered    levETIRAcetam (KEPPRA) 500 MG tablet  Daily at bedtime     01/04/18 2306            Reva Bores 01/04/18 2322    Linwood Dibbles, MD 01/05/18 1558
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
## 2018-01-05 NOTE — Telephone Encounter (Signed)
Darl PikesSusan, pls f/u with patient on 24-hour EEG, he was in the ER yesterday. Thanks

## 2018-01-07 LAB — LEVETIRACETAM LEVEL: LEVETIRACETAM: 6.5 ug/mL — AB (ref 10.0–40.0)

## 2018-01-11 ENCOUNTER — Ambulatory Visit (INDEPENDENT_AMBULATORY_CARE_PROVIDER_SITE_OTHER): Admitting: Neurology

## 2018-01-11 DIAGNOSIS — R569 Unspecified convulsions: Secondary | ICD-10-CM | POA: Diagnosis not present

## 2018-01-20 ENCOUNTER — Telehealth: Payer: Self-pay | Admitting: Neurology

## 2018-01-20 ENCOUNTER — Encounter: Payer: Self-pay | Admitting: Neurology

## 2018-01-20 MED ORDER — ZONISAMIDE 100 MG PO CAPS: ORAL_CAPSULE | ORAL | 11 refills | Status: DC

## 2018-01-20 NOTE — Telephone Encounter (Signed)
Spoke to patient, his 24-hour EEG was normal. He was in the ER last 11/14 after he had an episode early in the morning, he was dozing off, still awake, body stiffened up, like in a daydream, knew he was not asleep, could not control what was going on, he just popped up and lay back asleep and woke up in the afternoon. Discussed a trial of a different seizure medication, he is agreeable to starting Zonisamide, side effects discussed. Start Zonisamide 100mg  qhs x 2 weeks, then increase to 200mg  qhs. Once on this dose, he will stop the Keppra (he has only been taking 500mg  qhs).  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 Letter will be written for pickup.

## 2018-01-20 NOTE — Procedures (Signed)
ELECTROENCEPHALOGRAM REPORT  Dates of Recording: 01/11/2018 9:43AM to 01/12/2018 9:55AM  Patient's Name: Justin Sheppard MRN: [id number] Date of Birth: Jun 23, 1995  Referring Provider: Dr. Patrcia DollyKaren Kysa Calais  Procedure: 24-hour ambulatory EEG  History: This is a 22 year old man with new onset seizure on 12/12/17 with left gaze deviation and left-sided shaking. EEG for classification  Medications: Keppra  Technical Summary: This is a 24-hour multichannel digital EEG recording measured by the international 10-20 system with electrodes applied with paste and impedances below 5000 ohms performed as portable with EKG monitoring.  The digital EEG was referentially recorded, reformatted, and digitally filtered in a variety of bipolar and referential montages for optimal display.    DESCRIPTION OF RECORDING: During maximal wakefulness, there is no clear posterior dominant rhythm. The background is symmetric. There were no epileptiform discharges or focal slowing seen in wakefulness.  During the recording, the patient progresses through wakefulness, drowsiness, and Stage 2 sleep.  Again, there were no epileptiform discharges seen.  Events: On 11/25 at 0949 hours, he reports a body jerk. Electrographically, there were no EEG or EKG changes seen.  On 11/25 at 2340 hours, he has a painful body jerk. He is lying on bed, jerk not clearly visualized on video. Electrographically, there were no EEG or EKG changes seen.  There were no electrographic seizures seen.  EKG lead was unremarkable.  IMPRESSION: This 24-hour ambulatory EEG study is normal.    CLINICAL CORRELATION: A normal EEG does not exclude a clinical diagnosis of epilepsy. Body jerks did not show EEG correlate. Typical events were not captured. If further clinical questions remain, inpatient video EEG monitoring may be helpful.   Patrcia DollyKaren Dejon Lukas, M.D.

## 2018-01-28 ENCOUNTER — Telehealth: Payer: Self-pay | Admitting: *Deleted

## 2018-01-28 NOTE — Telephone Encounter (Signed)
Opened in error

## 2018-02-09 ENCOUNTER — Emergency Department (HOSPITAL_COMMUNITY)
Admission: EM | Admit: 2018-02-09 | Discharge: 2018-02-09 | Disposition: A | Discharge status: Home / Self Care | Attending: Emergency Medicine | Admitting: Emergency Medicine

## 2018-02-09 ENCOUNTER — Encounter (HOSPITAL_COMMUNITY): Payer: Self-pay | Admitting: Emergency Medicine

## 2018-02-09 ENCOUNTER — Emergency Department (HOSPITAL_COMMUNITY)

## 2018-02-09 DIAGNOSIS — B349 Viral infection, unspecified: Secondary | ICD-10-CM | POA: Diagnosis not present

## 2018-02-09 DIAGNOSIS — R509 Fever, unspecified: Secondary | ICD-10-CM | POA: Diagnosis present

## 2018-02-09 LAB — COMPREHENSIVE METABOLIC PANEL
ALT: 19 U/L (ref 0–44)
AST: 29 U/L (ref 15–41)
Albumin: 4.5 g/dL (ref 3.5–5.0)
Alkaline Phosphatase: 60 U/L (ref 38–126)
Anion gap: 12 (ref 5–15)
BILIRUBIN TOTAL: 1 mg/dL (ref 0.3–1.2)
BUN: 12 mg/dL (ref 6–20)
CO2: 23 mmol/L (ref 22–32)
CREATININE: 1.21 mg/dL (ref 0.61–1.24)
Calcium: 9.3 mg/dL (ref 8.9–10.3)
Chloride: 102 mmol/L (ref 98–111)
GFR calc non Af Amer: >60 mL/min (ref >60)
Glucose, Bld: 111 mg/dL — ABNORMAL HIGH (ref 70–99)
Potassium: 4 mmol/L (ref 3.5–5.1)
Sodium: 137 mmol/L (ref 135–145)
Total Protein: 8.1 g/dL (ref 6.5–8.1)

## 2018-02-09 LAB — CBC WITH DIFFERENTIAL/PLATELET
ABS IMMATURE GRANULOCYTES: 0.02 10*3/uL (ref 0.00–0.07)
BASOS PCT: 0 %
Basophils Absolute: 0 10*3/uL (ref 0.0–0.1)
Eosinophils Absolute: 0 10*3/uL (ref 0.0–0.5)
Eosinophils Relative: 0 %
HCT: 45.2 % (ref 39.0–52.0)
Hemoglobin: 15.7 g/dL (ref 13.0–17.0)
Immature Granulocytes: 1 %
LYMPHS PCT: 16 %
Lymphs Abs: 0.6 10*3/uL — ABNORMAL LOW (ref 0.7–4.0)
MCH: 30.7 pg (ref 26.0–34.0)
MCHC: 34.7 g/dL (ref 30.0–36.0)
MCV: 88.3 fL (ref 80.0–100.0)
MONO ABS: 0.4 10*3/uL (ref 0.1–1.0)
MONOS PCT: 12 %
NEUTROS ABS: 2.6 10*3/uL (ref 1.7–7.7)
Neutrophils Relative %: 71 %
Platelets: 182 10*3/uL (ref 150–400)
RBC: 5.12 MIL/uL (ref 4.22–5.81)
RDW: 10.7 % — ABNORMAL LOW (ref 11.5–15.5)
WBC: 3.6 10*3/uL — ABNORMAL LOW (ref 4.0–10.5)
nRBC: 0 % (ref 0.0–0.2)

## 2018-02-09 LAB — INFLUENZA PANEL BY PCR (TYPE A & B)
INFLAPCR: NEGATIVE
Influenza B By PCR: NEGATIVE

## 2018-02-09 LAB — LIPASE, BLOOD: Lipase: 37 U/L (ref 11–51)

## 2018-02-09 MED ORDER — ACETAMINOPHEN 325 MG PO TABS
650.0000 mg | ORAL_TABLET | Freq: Once | ORAL | Status: AC | PRN
  Administered 2018-02-09: 650 mg via ORAL
  Filled 2018-02-09: qty 2

## 2018-02-09 MED ORDER — SODIUM CHLORIDE 0.9 % IV BOLUS
1000.0000 mL | Freq: Once | INTRAVENOUS | Status: AC
  Administered 2018-02-09: 1000 mL via INTRAVENOUS

## 2018-02-09 NOTE — ED Triage Notes (Signed)
Pt c/o entire body hurting for couple days. Denies taking any medications for the pains

## 2018-02-09 NOTE — Discharge Instructions (Addendum)
Home to rest, continue to push hydrating fluids such as Gatorade at home.  Take Motrin and Tylenol as needed as directed for fever.  Return to ER for new or worsening symptoms.  Follow-up with your doctor, [facility].

## 2018-02-09 NOTE — ED Provider Notes (Signed)
[facility]-EMERGENCY DEPT Provider Note   CSN: [id number] Arrival date & time: 02/09/18  1201     History   Chief Complaint Chief Complaint  Patient presents with  . Generalized Body Aches  . Fever    HPI Justin Sheppard is a 22 y.o. male.  22 year old male brought to ER by his mother who provides majority of his history.  Patient has been complaining of not feeling well for the past 2 days, reports diffuse body aches with frontal headache and periumbilical abdominal pain.  Patient denies nausea, vomiting, changes in bowel or bladder habits, cough, congestion, sore throat or other URI symptoms.  No known sick contacts.  Patient was found to be febrile in triage, has not taken anything for fever prior to arrival and was unaware he had been running a fever, mom states he has had occasional chills.  No other complaints or concerns, patient provides little input into his history today.     Past Medical History:  Diagnosis Date  . Anemia   . G-6-PD variant enzyme deficiency anemia (HCC)   . Sickle cell trait [facility](HCC)     Patient Active Problem List   Diagnosis Date Noted  . Seizure (HCC) 12/12/2017    Past Surgical History:  Procedure Laterality Date  . WISDOM TOOTH EXTRACTION  2012        Home Medications    Prior to Admission medications   Medication Sig Start Date End Date Taking? Authorizing Provider  levETIRAcetam (KEPPRA PO) Take by mouth.    [provider]  zonisamide (ZONEGRAN) 100 MG capsule Take 1 capsule every night for 2 weeks, then increase to 2 capsules every night 01/20/18   Van ClinesAquino, Karen M, MD    Family History No family history on file.  Social History Social History   Tobacco Use  . Smoking status: Never Smoker  . Smokeless tobacco: Never Used  Substance Use Topics  . Alcohol use: Yes  . Drug use: No     Allergies   Aspirin; Penicillins; and Sulfa antibiotics   Review of Systems Review of Systems    Constitutional: Positive for chills and fever.  HENT: Negative for congestion, rhinorrhea, sneezing and sore throat.   Respiratory: Negative for cough and shortness of breath.   Cardiovascular: Negative for chest pain.  Gastrointestinal: Positive for abdominal pain. Negative for constipation, diarrhea, nausea and vomiting.  Genitourinary: Negative for decreased urine volume, difficulty urinating and urgency.  Musculoskeletal: Positive for arthralgias and myalgias. Negative for joint swelling, neck pain and neck stiffness.  Skin: Negative for rash and wound.  Allergic/Immunologic: Negative for immunocompromised state.  Neurological: Positive for headaches. Negative for weakness.  Hematological: Negative for adenopathy. Does not bruise/bleed easily.  Psychiatric/Behavioral: Negative for confusion.  All other systems reviewed and are negative.    Physical Exam Updated Vital Signs BP 127/69 (BP Location: Left Arm)   Pulse 83   Temp (!) 100.8 F (38.2 C) (Oral)   Resp 19   SpO2 99%   Physical Exam Vitals signs and nursing note reviewed.  Constitutional:      General: He is not in acute distress.    Appearance: He is well-developed. He is not diaphoretic.  HENT:     Head: Normocephalic and atraumatic.     Right Ear: Tympanic membrane and ear canal normal.     Left Ear: Tympanic membrane and ear canal normal.     Nose: Nose normal. No congestion or rhinorrhea.     Mouth/Throat:  Mouth: Mucous membranes are moist.     Pharynx: No oropharyngeal exudate or posterior oropharyngeal erythema.  Eyes:     Conjunctiva/sclera: Conjunctivae normal.  Neck:     Musculoskeletal: Neck supple. No neck rigidity.  Cardiovascular:     Rate and Rhythm: Normal rate and regular rhythm.     Pulses: Normal pulses.     Heart sounds: Normal heart sounds. No murmur.  Pulmonary:     Effort: Pulmonary effort is normal.     Breath sounds: Normal breath sounds. No wheezing.  Abdominal:     General:  Abdomen is flat. There is no distension.     Palpations: Abdomen is soft.     Tenderness: There is generalized abdominal tenderness. There is no guarding or rebound.     Comments: Mild tenderness with deep palpation.   Musculoskeletal:        General: No swelling.  Lymphadenopathy:     Cervical: No cervical adenopathy.  Skin:    General: Skin is warm and dry.     Findings: No erythema or rash.  Neurological:     Mental Status: He is alert and oriented to person, place, and time.  Psychiatric:        Behavior: Behavior normal.      ED Treatments / Results  Labs (all labs ordered are listed, but only abnormal results are displayed) Labs Reviewed  CBC WITH DIFFERENTIAL/PLATELET - Abnormal; Notable for the following components:      Result Value   WBC 3.6 (*)    RDW 10.7 (*)    Lymphs Abs 0.6 (*)    All other components within normal limits  COMPREHENSIVE METABOLIC PANEL - Abnormal; Notable for the following components:   Glucose, Bld 111 (*)    All other components within normal limits  LIPASE, BLOOD  INFLUENZA PANEL BY PCR (TYPE A & B)    EKG None  Radiology Dg Chest 2 View  Result Date: 02/09/2018 CLINICAL DATA:  Fever and malaise for several days. EXAM: CHEST - 2 VIEW COMPARISON:  12/13/2012 FINDINGS: The heart size and mediastinal contours are within normal limits. Both lungs are clear. The visualized skeletal structures are unremarkable. IMPRESSION: Negative.  No active cardiopulmonary disease. Electronically Signed   By: Myles Rosenthal M.D.   On: 02/09/2018 16:20    Procedures Procedures (including critical care time)  Medications Ordered in ED Medications  acetaminophen (TYLENOL) tablet 650 mg (650 mg Oral Given 02/09/18 1344)  sodium chloride 0.9 % bolus 1,000 mL (0 mLs Intravenous Stopped 02/09/18 1558)     Initial Impression / Assessment and Plan / ED Course  I have reviewed the triage vital signs and the nursing notes.  Pertinent labs & imaging results  that were available during my care of the patient were reviewed by me and considered in my medical decision making (see chart for details).  Clinical Course as of Feb 10 1844  Tue Feb 09, 2018  3272 A 66-year-old male presents with complaint of body aches, headache, fevers.  Review of lab work shows negative flu swab, lipase normal, CMP unremarkable, CBC with slightly lower white count at 3.6, similar to previous labs on file for patient.  Initial temperature upon arrival in the ER is 92.9, has improved 100.8 after Tylenol.  Patient was given IV fluids he is feeling better.  Chest x-ray is negative.  Awaiting urine sample.  Suspect viral illness.  Discussed results with patient who is attempting to provide urine sample at this time.   [  LM]  1844 Patient does not any urinary symptoms, be discharged at this time to go home and rest, he is feeling significantly improved since given fluids and Tylenol.  Advised to continue with Motrin Tylenol at home, return to ER for new or worsening symptoms otherwise follow-up with PCP.   [LM]    Clinical Course User Index [LM] Jeannie FendMurphy, Beckham Buxbaum A, PA-C   Final Clinical Impressions(s) / ED Diagnoses   Final diagnoses:  Viral illness    ED Discharge Orders    None       Alden HippMurphy, Creta Dorame A, PA-C 02/09/18 1845    Linwood DibblesKnapp, Jon, MD 02/10/18 [phone]

## 2018-03-02 ENCOUNTER — Ambulatory Visit: Payer: Self-pay | Admitting: Neurology

## 2018-03-22 ENCOUNTER — Telehealth: Payer: Self-pay

## 2018-03-22 NOTE — Telephone Encounter (Signed)
Spoke with pt letting him know that his paperwork has been faxed to [employer].  Pt states that he may need to cancel his next appointment - I advised that pt does not have one scheduled at this time.  Looking at LOV notes, it states that we would call with f/u instructions after tests.  I let pt know that I would see when Dr. Karel Jarvis want to see him back and we will take it from there.  pls advise.

## 2018-03-22 NOTE — Telephone Encounter (Signed)
We did start a medication for seizure, so he should follow-up. Looks like I have an opening on April 16 at 11am, if he can come. Thanks

## 2018-03-22 NOTE — Telephone Encounter (Signed)
Called pt to relay message below.  LMOM offering appt time below.  Time slot blocked (I think I did it correctly) - waiting for response  Chelsea / Gearldine Bienenstock - FYI

## 2018-03-30 ENCOUNTER — Ambulatory Visit: Admitting: Neurology

## 2018-04-01 ENCOUNTER — Emergency Department (HOSPITAL_COMMUNITY)
Admission: EM | Admit: 2018-04-01 | Discharge: 2018-04-02 | Disposition: A | Discharge status: Home / Self Care | Attending: Emergency Medicine | Admitting: Emergency Medicine

## 2018-04-01 ENCOUNTER — Encounter (HOSPITAL_COMMUNITY): Payer: Self-pay | Admitting: Emergency Medicine

## 2018-04-01 ENCOUNTER — Other Ambulatory Visit: Payer: Self-pay

## 2018-04-01 DIAGNOSIS — D573 Sickle-cell trait: Secondary | ICD-10-CM | POA: Insufficient documentation

## 2018-04-01 DIAGNOSIS — Z036 Encounter for observation for suspected toxic effect from ingested substance ruled out: Secondary | ICD-10-CM | POA: Diagnosis not present

## 2018-04-01 DIAGNOSIS — Z7251 High risk heterosexual behavior: Secondary | ICD-10-CM

## 2018-04-01 DIAGNOSIS — Z113 Encounter for screening for infections with a predominantly sexual mode of transmission: Secondary | ICD-10-CM

## 2018-04-01 NOTE — ED Triage Notes (Signed)
Pt here for exposure to unknown STD. Wants to be screened for everything.

## 2018-04-02 LAB — GC/CHLAMYDIA PROBE AMP (~~LOC~~) NOT AT ARMC
CHLAMYDIA, DNA PROBE: NEGATIVE
NEISSERIA GONORRHEA: NEGATIVE

## 2018-04-02 LAB — RPR: RPR: NONREACTIVE

## 2018-04-02 LAB — HIV ANTIBODY (ROUTINE TESTING W REFLEX): HIV SCREEN 4TH GENERATION: NONREACTIVE

## 2018-04-02 NOTE — ED Provider Notes (Signed)
[facility] EMERGENCY DEPARTMENT Provider Note   CSN: [id number] Arrival date & time: 04/01/18  2159     History   Chief Complaint Chief Complaint  Patient presents with  . Exposure to STD    HPI Justin Sheppard is a 23 y.o. male.  23 year old male with a history of sickle cell trait and G6PD presents to the emergency department for STD screening.  He is not having any symptoms and specifically denies nausea, vomiting, fever, abdominal pain, dysuria, penile discharge, testicular tenderness, scrotal swelling.  States that he came because 1 of his former partners told him that he should be tested.  Reports intermittent use of condoms during sexual activity.  Is sexually active with both men and women.  He has had 2 partners in the past 6 months.  Reports last having negative STD testing on 02/23/2018.  The history is provided by the patient. No language interpreter was used.  Exposure to STD     Past Medical History:  Diagnosis Date  . Anemia   . G-6-PD variant enzyme deficiency anemia (HCC)   . Sickle cell trait [facility])     Patient Active Problem List   Diagnosis Date Noted  . Seizure (HCC) 12/12/2017    Past Surgical History:  Procedure Laterality Date  . WISDOM TOOTH EXTRACTION  2012        Home Medications    Prior to Admission medications   Medication Sig Start Date End Date Taking? Authorizing Provider  levETIRAcetam (KEPPRA PO) Take by mouth.    [provider]  zonisamide (ZONEGRAN) 100 MG capsule Take 1 capsule every night for 2 weeks, then increase to 2 capsules every night 01/20/18   Van Clines, MD    Family History No family history on file.  Social History Social History   Tobacco Use  . Smoking status: Never Smoker  . Smokeless tobacco: Never Used  Substance Use Topics  . Alcohol use: Yes  . Drug use: No     Allergies   Aspirin; Penicillins; and Sulfa antibiotics   Review of Systems Review of  Systems Ten systems reviewed and are negative for acute change, except as noted in the HPI.    Physical Exam Updated Vital Signs BP (!) 155/71 (BP Location: Right Arm)   Pulse (!) 54   Temp 98 F (36.7 C) (Oral)   Resp 16   Wt 81.6 kg   SpO2 100%   BMI 23.75 kg/m   Physical Exam Vitals signs and nursing note reviewed.  Constitutional:      General: He is not in acute distress.    Appearance: He is well-developed. He is not diaphoretic.     Comments: Nontoxic appearing and in NAD  HENT:     Head: Normocephalic and atraumatic.  Eyes:     General: No scleral icterus.    Conjunctiva/sclera: Conjunctivae normal.  Neck:     Musculoskeletal: Normal range of motion.  Cardiovascular:     Rate and Rhythm: Normal rate and regular rhythm.     Pulses: Normal pulses.  Pulmonary:     Effort: Pulmonary effort is normal. No respiratory distress.     Breath sounds: No stridor. No wheezing, rhonchi or rales.     Comments: Lungs CTAB Abdominal:     Comments: Soft, nontender abdomen.  Musculoskeletal: Normal range of motion.  Skin:    General: Skin is warm and dry.     Coloration: Skin is not pale.  Findings: No erythema or rash.  Neurological:     Mental Status: He is alert and oriented to person, place, and time.     Coordination: Coordination normal.  Psychiatric:        Behavior: Behavior normal.      ED Treatments / Results  Labs (all labs ordered are listed, but only abnormal results are displayed) Labs Reviewed  HIV ANTIBODY (ROUTINE TESTING W REFLEX)  RPR  GC/CHLAMYDIA PROBE AMP ([place]) NOT AT [facility]    EKG None  Radiology No results found.  Procedures Procedures (including critical care time)  Medications Ordered in ED Medications - No data to display   Initial Impression / Assessment and Plan / ED Course  I have reviewed the triage vital signs and the nursing notes.  Pertinent labs & imaging results that were available during my care of the  patient were reviewed by me and considered in my medical decision making (see chart for details).     23 year old male presents to the emergency department for STD screening.  He is asymptomatic.  No indication for prophylactic treatment at this time.  He has been given instruction on work to follow-up on the results of his STD test.  Encouraged safe sex practices.  Return precautions discussed and provided. Patient discharged in stable condition with no unaddressed concerns.   Final Clinical Impressions(s) / ED Diagnoses   Final diagnoses:  History of unprotected sex  Screen for STD (sexually transmitted disease)    ED Discharge Orders    None       Antony Madura, PA-C 04/02/18 0047    Zadie Rhine, MD 04/02/18 [phone]

## 2018-04-02 NOTE — Discharge Instructions (Addendum)
Follow-up with the health department in 48 hours for the results of your STD tests.  You may also access these results through MyChart.  If you test positive for STDs, notify all sexual partners of their need to be tested and treated as well. Do not engage in sexual intercourse for one week after treatment. Use a condom when sexually active.

## 2018-06-03 ENCOUNTER — Telehealth (INDEPENDENT_AMBULATORY_CARE_PROVIDER_SITE_OTHER): Admitting: Neurology

## 2018-06-03 ENCOUNTER — Encounter: Payer: Self-pay | Admitting: Neurology

## 2018-06-03 ENCOUNTER — Other Ambulatory Visit: Payer: Self-pay

## 2018-06-03 VITALS — Ht 73.0 in | Wt 192.0 lb

## 2018-06-03 DIAGNOSIS — G40009 Localization-related (focal) (partial) idiopathic epilepsy and epileptic syndromes with seizures of localized onset, not intractable, without status epilepticus: Secondary | ICD-10-CM

## 2018-06-03 MED ORDER — ZONISAMIDE 100 MG PO CAPS: ORAL_CAPSULE | ORAL | 3 refills | Status: AC
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
## 2019-01-17 ENCOUNTER — Ambulatory Visit: Admitting: Neurology

## 2019-10-11 ENCOUNTER — Telehealth: Payer: Self-pay | Admitting: Neurology

## 2019-10-11 NOTE — Telephone Encounter (Signed)
Patient needs to know of the name of his medication for his seizures  Please call

## 2019-10-12 NOTE — Telephone Encounter (Signed)
Called patient and informed him that his seizure medication is Zonisamide 100mg  capsule.
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
